# Patient Record
Sex: Female | Born: 1965 | Race: White | Hispanic: No | Marital: Married | Smoking: Former smoker
Health system: Southern US, Community
[De-identification: ages and names within clinical notes are randomized; demographics above are authoritative.]

## PROBLEM LIST (undated history)

## (undated) DIAGNOSIS — I499 Cardiac arrhythmia, unspecified: Secondary | ICD-10-CM

## (undated) DIAGNOSIS — N390 Urinary tract infection, site not specified: Secondary | ICD-10-CM

## (undated) DIAGNOSIS — I809 Phlebitis and thrombophlebitis of unspecified site: Secondary | ICD-10-CM

## (undated) DIAGNOSIS — R42 Dizziness and giddiness: Secondary | ICD-10-CM

## (undated) DIAGNOSIS — R1319 Other dysphagia: Secondary | ICD-10-CM

## (undated) DIAGNOSIS — I10 Essential (primary) hypertension: Secondary | ICD-10-CM

## (undated) DIAGNOSIS — R112 Nausea with vomiting, unspecified: Secondary | ICD-10-CM

## (undated) DIAGNOSIS — Z9889 Other specified postprocedural states: Secondary | ICD-10-CM

## (undated) DIAGNOSIS — K219 Gastro-esophageal reflux disease without esophagitis: Secondary | ICD-10-CM

## (undated) HISTORY — PX: TUBAL LIGATION: SHX77

## (undated) HISTORY — PX: CHOLECYSTECTOMY: SHX55

## (undated) HISTORY — PX: ABDOMINOPLASTY: SUR9

## (undated) HISTORY — PX: SEPTOPLASTY: SUR1290

---

## 2010-09-26 HISTORY — PX: NECK SURGERY: SHX720

## 2011-10-15 ENCOUNTER — Other Ambulatory Visit: Payer: Self-pay | Admitting: Neurosurgery

## 2011-11-25 ENCOUNTER — Encounter (HOSPITAL_COMMUNITY): Payer: Self-pay | Admitting: Pharmacy Technician

## 2011-11-26 ENCOUNTER — Encounter (HOSPITAL_COMMUNITY)
Admission: RE | Admit: 2011-11-26 | Discharge: 2011-11-26 | Disposition: A | Discharge status: Home / Self Care | Payer: BC Managed Care – PPO | Source: Ambulatory Visit | Attending: Neurosurgery | Admitting: Neurosurgery

## 2011-11-26 ENCOUNTER — Encounter (HOSPITAL_COMMUNITY): Payer: Self-pay

## 2011-11-26 HISTORY — DX: Essential (primary) hypertension: I10

## 2011-11-26 HISTORY — DX: Dizziness and giddiness: R42

## 2011-11-26 HISTORY — DX: Gastro-esophageal reflux disease without esophagitis: K21.9

## 2011-11-26 HISTORY — DX: Other specified postprocedural states: Z98.890

## 2011-11-26 HISTORY — DX: Cardiac arrhythmia, unspecified: I49.9

## 2011-11-26 HISTORY — DX: Other specified postprocedural states: R11.2

## 2011-11-26 HISTORY — DX: Urinary tract infection, site not specified: N39.0

## 2011-11-26 HISTORY — DX: Other dysphagia: R13.19

## 2011-11-26 LAB — CBC
MCV: 89.9 fL (ref 78.0–100.0)
Platelets: 256 10*3/uL (ref 150–400)
RBC: 4.65 MIL/uL (ref 3.87–5.11)
RDW: 12.4 % (ref 11.5–15.5)
WBC: 10.7 10*3/uL — ABNORMAL HIGH (ref 4.0–10.5)

## 2011-11-26 LAB — SURGICAL PCR SCREEN: Staphylococcus aureus: POSITIVE — AB

## 2011-11-26 LAB — HCG, SERUM, QUALITATIVE: Preg, Serum: NEGATIVE

## 2011-11-26 NOTE — Progress Notes (Addendum)
Primary Physician/Cardiologist - Dr. Earna Coder - Octavio Manns, [place] EKG, xray 3 weeks ago at Dr,. Earna Coder office - will request records Cardiac Cath in 2008

## 2011-11-26 NOTE — Pre-Procedure Instructions (Signed)
20 Stacy Johnston  11/26/2011   Your procedure is scheduled on:  Thursday, November 14th  Report to [facility] at 0530 AM.  Call this number if you have problems the morning of surgery: [phone]   Remember:   Do not eat food or drink:After Midnight.   Take these medicines the morning of surgery with A SIP OF WATER: toprol, valium if needed, nexium, hydrocodone if needed   Do not wear jewelry, make-up or nail polish.  Do not wear lotions, powders, or perfumes.   Do not shave 48 hours prior to surgery. Men may shave face and neck.  Do not bring valuables to the hospital.  Contacts, dentures or bridgework may not be worn into surgery.  Leave suitcase in the car. After surgery it may be brought to your room.  For patients admitted to the hospital, checkout time is 11:00 AM the day of discharge.   Patients discharged the day of surgery will not be allowed to drive home.   Special Instructions: Shower using CHG 2 nights before surgery and the night before surgery.  If you shower the day of surgery use CHG.  Use special wash - you have one bottle of CHG for all showers.  You should use approximately 1/3 of the bottle for each shower.   Please read over the following fact sheets that you were given: Pain Booklet, Coughing and Deep Breathing, MRSA Information and Surgical Site Infection Prevention

## 2011-11-29 NOTE — Progress Notes (Signed)
Dr. Oneita Jolly office does not have cxr or cath report. Stated to request at [facility], which I did request.

## 2011-11-29 NOTE — Progress Notes (Signed)
Received cath report from [facility].  Chest X-ray dated 12/2006. Will need cxr dos.

## 2011-12-08 MED ORDER — CEFAZOLIN SODIUM-DEXTROSE 2-3 GM-% IV SOLR
2.0000 g | INTRAVENOUS | Status: AC
  Administered 2011-12-09: 2 g via INTRAVENOUS
  Filled 2011-12-08: qty 50

## 2011-12-09 ENCOUNTER — Ambulatory Visit (HOSPITAL_COMMUNITY): Payer: BC Managed Care – PPO

## 2011-12-09 ENCOUNTER — Ambulatory Visit (HOSPITAL_COMMUNITY): Payer: BC Managed Care – PPO | Admitting: Certified Registered"

## 2011-12-09 ENCOUNTER — Encounter (HOSPITAL_COMMUNITY): Payer: Self-pay | Admitting: Certified Registered"

## 2011-12-09 ENCOUNTER — Encounter (HOSPITAL_COMMUNITY): Payer: Self-pay | Admitting: *Deleted

## 2011-12-09 ENCOUNTER — Observation Stay (HOSPITAL_COMMUNITY)
Admission: RE | Admit: 2011-12-09 | Discharge: 2011-12-10 | DRG: 473 | Disposition: A | Discharge status: Home / Self Care | Payer: BC Managed Care – PPO | Source: Ambulatory Visit | Attending: Neurosurgery | Admitting: Neurosurgery

## 2011-12-09 ENCOUNTER — Encounter (HOSPITAL_COMMUNITY)
Admission: RE | Disposition: A | Discharge status: Home / Self Care | Payer: Self-pay | Source: Ambulatory Visit | Attending: Neurosurgery

## 2011-12-09 DIAGNOSIS — T84498A Other mechanical complication of other internal orthopedic devices, implants and grafts, initial encounter: Principal | ICD-10-CM | POA: Insufficient documentation

## 2011-12-09 DIAGNOSIS — K219 Gastro-esophageal reflux disease without esophagitis: Secondary | ICD-10-CM | POA: Insufficient documentation

## 2011-12-09 DIAGNOSIS — Y838 Other surgical procedures as the cause of abnormal reaction of the patient, or of later complication, without mention of misadventure at the time of the procedure: Secondary | ICD-10-CM | POA: Insufficient documentation

## 2011-12-09 DIAGNOSIS — F17200 Nicotine dependence, unspecified, uncomplicated: Secondary | ICD-10-CM | POA: Insufficient documentation

## 2011-12-09 DIAGNOSIS — Z01812 Encounter for preprocedural laboratory examination: Secondary | ICD-10-CM | POA: Insufficient documentation

## 2011-12-09 DIAGNOSIS — G8929 Other chronic pain: Secondary | ICD-10-CM | POA: Insufficient documentation

## 2011-12-09 DIAGNOSIS — I10 Essential (primary) hypertension: Secondary | ICD-10-CM | POA: Insufficient documentation

## 2011-12-09 DIAGNOSIS — M47812 Spondylosis without myelopathy or radiculopathy, cervical region: Secondary | ICD-10-CM | POA: Insufficient documentation

## 2011-12-09 HISTORY — PX: POSTERIOR CERVICAL FUSION/FORAMINOTOMY: SHX5038

## 2011-12-09 HISTORY — DX: Phlebitis and thrombophlebitis of unspecified site: I80.9

## 2011-12-09 SURGERY — POSTERIOR CERVICAL FUSION/FORAMINOTOMY LEVEL 1
Anesthesia: General | Site: Neck | Wound class: Clean

## 2011-12-09 MED ORDER — PANTOPRAZOLE SODIUM 40 MG IV SOLR: 40.0000 mg | Freq: Every day | INTRAVENOUS | Status: DC

## 2011-12-09 MED ORDER — ACETAMINOPHEN 650 MG RE SUPP: 650.0000 mg | RECTAL | Status: DC | PRN

## 2011-12-09 MED ORDER — SODIUM CHLORIDE 0.9 % IJ SOLN: 3.0000 mL | INTRAMUSCULAR | Status: DC | PRN

## 2011-12-09 MED ORDER — LACTATED RINGERS IV SOLN
INTRAVENOUS | Status: DC | PRN
  Administered 2011-12-09 (×2): via INTRAVENOUS

## 2011-12-09 MED ORDER — HYDROMORPHONE HCL PF 1 MG/ML IJ SOLN
0.2500 mg | INTRAMUSCULAR | Status: DC | PRN
  Administered 2011-12-09 (×4): 0.5 mg via INTRAVENOUS

## 2011-12-09 MED ORDER — 0.9 % SODIUM CHLORIDE (POUR BTL) OPTIME
TOPICAL | Status: DC | PRN
  Administered 2011-12-09: 1000 mL

## 2011-12-09 MED ORDER — MUPIROCIN 2 % EX OINT: 1.0000 "application " | TOPICAL_OINTMENT | Freq: Two times a day (BID) | CUTANEOUS | Status: DC

## 2011-12-09 MED ORDER — DIAZEPAM 5 MG PO TABS
5.0000 mg | ORAL_TABLET | Freq: Four times a day (QID) | ORAL | Status: DC | PRN
  Administered 2011-12-09 – 2011-12-10 (×3): 5 mg via ORAL
  Administered 2011-12-10: 10 mg via ORAL
  Filled 2011-12-09 (×3): qty 1
  Filled 2011-12-09: qty 2

## 2011-12-09 MED ORDER — HYDROXYZINE HCL 50 MG/ML IM SOLN
25.0000 mg | Freq: Four times a day (QID) | INTRAMUSCULAR | Status: DC | PRN
  Administered 2011-12-09: 25 mg via INTRAMUSCULAR
  Filled 2011-12-09: qty 1

## 2011-12-09 MED ORDER — FENTANYL CITRATE 0.05 MG/ML IJ SOLN
INTRAMUSCULAR | Status: DC | PRN
  Administered 2011-12-09 (×5): 50 ug via INTRAVENOUS

## 2011-12-09 MED ORDER — POLYETHYLENE GLYCOL 3350 17 G PO PACK
17.0000 g | PACK | Freq: Every day | ORAL | Status: DC | PRN
  Filled 2011-12-09: qty 1

## 2011-12-09 MED ORDER — LIDOCAINE-EPINEPHRINE 1 %-1:100000 IJ SOLN
INTRAMUSCULAR | Status: DC | PRN
  Administered 2011-12-09: 5 mL

## 2011-12-09 MED ORDER — ONDANSETRON HCL 4 MG/2ML IJ SOLN
4.0000 mg | INTRAMUSCULAR | Status: DC | PRN
  Administered 2011-12-09: 4 mg via INTRAVENOUS
  Filled 2011-12-09: qty 2

## 2011-12-09 MED ORDER — ARTIFICIAL TEARS OP OINT
TOPICAL_OINTMENT | OPHTHALMIC | Status: DC | PRN
  Administered 2011-12-09: 1 via OPHTHALMIC

## 2011-12-09 MED ORDER — SODIUM CHLORIDE 0.9 % IV SOLN
INTRAVENOUS | Status: AC
  Filled 2011-12-09: qty 500

## 2011-12-09 MED ORDER — SODIUM CHLORIDE 0.9 % IR SOLN
Status: DC | PRN
  Administered 2011-12-09: 09:00:00

## 2011-12-09 MED ORDER — HYDROMORPHONE HCL PF 1 MG/ML IJ SOLN: 0.5000 mg | INTRAMUSCULAR | Status: DC | PRN

## 2011-12-09 MED ORDER — LIDOCAINE HCL (CARDIAC) 20 MG/ML IV SOLN
INTRAVENOUS | Status: DC | PRN
  Administered 2011-12-09: 80 mg via INTRAVENOUS

## 2011-12-09 MED ORDER — ALUM & MAG HYDROXIDE-SIMETH 200-200-20 MG/5ML PO SUSP: 30.0000 mL | Freq: Four times a day (QID) | ORAL | Status: DC | PRN

## 2011-12-09 MED ORDER — ACETAMINOPHEN 325 MG PO TABS: 650.0000 mg | ORAL_TABLET | ORAL | Status: DC | PRN

## 2011-12-09 MED ORDER — PANTOPRAZOLE SODIUM 40 MG PO TBEC
40.0000 mg | DELAYED_RELEASE_TABLET | Freq: Every day | ORAL | Status: DC
  Administered 2011-12-09: 40 mg via ORAL
  Filled 2011-12-09: qty 1

## 2011-12-09 MED ORDER — HYDROMORPHONE HCL PF 1 MG/ML IJ SOLN
INTRAMUSCULAR | Status: AC
  Filled 2011-12-09: qty 1

## 2011-12-09 MED ORDER — PROPOFOL 10 MG/ML IV BOLUS
INTRAVENOUS | Status: DC | PRN
  Administered 2011-12-09: 40 mg via INTRAVENOUS
  Administered 2011-12-09: 160 mg via INTRAVENOUS

## 2011-12-09 MED ORDER — MENTHOL 3 MG MT LOZG: 1.0000 | LOZENGE | OROMUCOSAL | Status: DC | PRN

## 2011-12-09 MED ORDER — FLEET ENEMA 7-19 GM/118ML RE ENEM
1.0000 | ENEMA | Freq: Once | RECTAL | Status: AC | PRN
  Filled 2011-12-09: qty 1

## 2011-12-09 MED ORDER — MIDAZOLAM HCL 5 MG/5ML IJ SOLN
INTRAMUSCULAR | Status: DC | PRN
  Administered 2011-12-09: 2 mg via INTRAVENOUS

## 2011-12-09 MED ORDER — SENNA 8.6 MG PO TABS
1.0000 | ORAL_TABLET | Freq: Two times a day (BID) | ORAL | Status: DC
  Administered 2011-12-09 – 2011-12-10 (×2): 8.6 mg via ORAL
  Filled 2011-12-09 (×3): qty 1

## 2011-12-09 MED ORDER — ONDANSETRON HCL 4 MG/2ML IJ SOLN
INTRAMUSCULAR | Status: DC | PRN
  Administered 2011-12-09: 4 mg via INTRAVENOUS

## 2011-12-09 MED ORDER — PHENOL 1.4 % MT LIQD: 1.0000 | OROMUCOSAL | Status: DC | PRN

## 2011-12-09 MED ORDER — SODIUM CHLORIDE 0.9 % IJ SOLN
3.0000 mL | Freq: Two times a day (BID) | INTRAMUSCULAR | Status: DC
  Administered 2011-12-09: 3 mL via INTRAVENOUS

## 2011-12-09 MED ORDER — CEFAZOLIN SODIUM 1-5 GM-% IV SOLN
1.0000 g | Freq: Three times a day (TID) | INTRAVENOUS | Status: AC
  Administered 2011-12-09 (×2): 1 g via INTRAVENOUS
  Filled 2011-12-09 (×2): qty 50

## 2011-12-09 MED ORDER — DOCUSATE SODIUM 100 MG PO CAPS
100.0000 mg | ORAL_CAPSULE | Freq: Two times a day (BID) | ORAL | Status: DC
  Administered 2011-12-09 – 2011-12-10 (×2): 100 mg via ORAL
  Filled 2011-12-09 (×2): qty 1

## 2011-12-09 MED ORDER — HYDROCODONE-ACETAMINOPHEN 5-325 MG PO TABS
ORAL_TABLET | ORAL | Status: AC
  Filled 2011-12-09: qty 1

## 2011-12-09 MED ORDER — BACITRACIN ZINC 500 UNIT/GM EX OINT
TOPICAL_OINTMENT | CUTANEOUS | Status: DC | PRN
  Administered 2011-12-09: 1 via TOPICAL

## 2011-12-09 MED ORDER — ADULT MULTIVITAMIN W/MINERALS CH
1.0000 | ORAL_TABLET | Freq: Every day | ORAL | Status: DC
  Administered 2011-12-09: 1 via ORAL
  Filled 2011-12-09 (×2): qty 1

## 2011-12-09 MED ORDER — LAMOTRIGINE 25 MG PO TABS
50.0000 mg | ORAL_TABLET | Freq: Every day | ORAL | Status: DC
  Administered 2011-12-09: 50 mg via ORAL
  Filled 2011-12-09 (×2): qty 2

## 2011-12-09 MED ORDER — DOXEPIN HCL 50 MG PO CAPS
50.0000 mg | ORAL_CAPSULE | Freq: Every day | ORAL | Status: DC
  Administered 2011-12-09: 50 mg via ORAL
  Filled 2011-12-09 (×2): qty 1

## 2011-12-09 MED ORDER — OXYCODONE HCL 5 MG/5ML PO SOLN: 5.0000 mg | Freq: Once | ORAL | Status: DC | PRN

## 2011-12-09 MED ORDER — THROMBIN 5000 UNITS EX SOLR
CUTANEOUS | Status: DC | PRN
  Administered 2011-12-09 (×2): 5000 [IU] via TOPICAL

## 2011-12-09 MED ORDER — SODIUM CHLORIDE 0.9 % IV SOLN: 250.0000 mL | INTRAVENOUS | Status: DC

## 2011-12-09 MED ORDER — OXYCODONE-ACETAMINOPHEN 5-325 MG PO TABS
1.0000 | ORAL_TABLET | ORAL | Status: DC | PRN
  Administered 2011-12-09 – 2011-12-10 (×4): 2 via ORAL
  Filled 2011-12-09 (×4): qty 2

## 2011-12-09 MED ORDER — OXYCODONE HCL 5 MG PO TABS: 5.0000 mg | ORAL_TABLET | Freq: Once | ORAL | Status: DC | PRN

## 2011-12-09 MED ORDER — BACITRACIN 50000 UNITS IM SOLR
INTRAMUSCULAR | Status: AC
  Filled 2011-12-09: qty 1

## 2011-12-09 MED ORDER — BISACODYL 10 MG RE SUPP: 10.0000 mg | Freq: Every day | RECTAL | Status: DC | PRN

## 2011-12-09 MED ORDER — HYDROCODONE-ACETAMINOPHEN 10-325 MG PO TABS: 1.0000 | ORAL_TABLET | Freq: Three times a day (TID) | ORAL | Status: DC | PRN

## 2011-12-09 MED ORDER — SCOPOLAMINE 1 MG/3DAYS TD PT72
MEDICATED_PATCH | TRANSDERMAL | Status: AC
  Filled 2011-12-09: qty 1

## 2011-12-09 MED ORDER — HYDROCODONE-ACETAMINOPHEN 5-325 MG PO TABS
1.0000 | ORAL_TABLET | ORAL | Status: DC | PRN
  Administered 2011-12-09: 1 via ORAL
  Filled 2011-12-09: qty 2

## 2011-12-09 MED ORDER — KCL IN DEXTROSE-NACL 20-5-0.45 MEQ/L-%-% IV SOLN
INTRAVENOUS | Status: DC
  Filled 2011-12-09 (×3): qty 1000

## 2011-12-09 MED ORDER — ALPRAZOLAM 0.5 MG PO TABS
1.0000 mg | ORAL_TABLET | Freq: Every day | ORAL | Status: DC
  Administered 2011-12-09: 1 mg via ORAL
  Filled 2011-12-09: qty 2

## 2011-12-09 MED ORDER — ROCURONIUM BROMIDE 100 MG/10ML IV SOLN
INTRAVENOUS | Status: DC | PRN
  Administered 2011-12-09: 50 mg via INTRAVENOUS

## 2011-12-09 MED ORDER — HYDROCHLOROTHIAZIDE 12.5 MG PO CAPS
12.5000 mg | ORAL_CAPSULE | Freq: Every day | ORAL | Status: DC
  Administered 2011-12-09: 12.5 mg via ORAL
  Filled 2011-12-09 (×2): qty 1

## 2011-12-09 MED ORDER — MORPHINE SULFATE 2 MG/ML IJ SOLN
1.0000 mg | INTRAMUSCULAR | Status: DC | PRN
  Administered 2011-12-09 – 2011-12-10 (×5): 4 mg via INTRAVENOUS
  Filled 2011-12-09 (×5): qty 2

## 2011-12-09 MED ORDER — BUPIVACAINE HCL (PF) 0.5 % IJ SOLN
INTRAMUSCULAR | Status: DC | PRN
  Administered 2011-12-09: 5 mL

## 2011-12-09 MED ORDER — SCOPOLAMINE 1 MG/3DAYS TD PT72
MEDICATED_PATCH | TRANSDERMAL | Status: DC | PRN
  Administered 2011-12-09: 1 via TRANSDERMAL

## 2011-12-09 MED ORDER — PROMETHAZINE HCL 25 MG/ML IJ SOLN: 6.2500 mg | INTRAMUSCULAR | Status: DC | PRN

## 2011-12-09 MED ORDER — METOPROLOL SUCCINATE ER 50 MG PO TB24
50.0000 mg | ORAL_TABLET | Freq: Every day | ORAL | Status: DC
  Filled 2011-12-09 (×2): qty 1

## 2011-12-09 MED ORDER — PROMETHAZINE HCL 25 MG/ML IJ SOLN
INTRAMUSCULAR | Status: AC
  Filled 2011-12-09: qty 1

## 2011-12-09 MED ORDER — HEMOSTATIC AGENTS (NO CHARGE) OPTIME
TOPICAL | Status: DC | PRN
  Administered 2011-12-09: 1 via TOPICAL

## 2011-12-09 MED ORDER — DIAZEPAM 5 MG PO TABS
ORAL_TABLET | ORAL | Status: AC
  Filled 2011-12-09: qty 1

## 2011-12-09 SURGICAL SUPPLY — 71 items
BAG DECANTER FOR FLEXI CONT (MISCELLANEOUS) ×2 IMPLANT
BENZOIN TINCTURE PRP APPL 2/3 (GAUZE/BANDAGES/DRESSINGS) ×4 IMPLANT
BIT DRILL NEURO 2X3.1 SFT TUCH (MISCELLANEOUS) ×1 IMPLANT
BLADE SURG 11 STRL SS (BLADE) ×2 IMPLANT
BLADE SURG ROTATE 9660 (MISCELLANEOUS) IMPLANT
BLADE ULTRA TIP 2M (BLADE) IMPLANT
BONE VOID FILLER STRIP 10CC (Bone Implant) ×2 IMPLANT
BUR PRECISION FLUTE 5.0 (BURR) ×2 IMPLANT
CANISTER SUCTION 2500CC (MISCELLANEOUS) ×2 IMPLANT
CLOTH BEACON ORANGE TIMEOUT ST (SAFETY) ×2 IMPLANT
CONT SPEC 4OZ CLIKSEAL STRL BL (MISCELLANEOUS) ×2 IMPLANT
DRAPE LAPAROTOMY 100X72 PEDS (DRAPES) ×2 IMPLANT
DRAPE MICROSCOPE LEICA (MISCELLANEOUS) IMPLANT
DRAPE POUCH INSTRU U-SHP 10X18 (DRAPES) ×2 IMPLANT
DRESSING TELFA 8X3 (GAUZE/BANDAGES/DRESSINGS) ×2 IMPLANT
DRILL NEURO 2X3.1 SOFT TOUCH (MISCELLANEOUS) ×2
DRSG PAD ABDOMINAL 8X10 ST (GAUZE/BANDAGES/DRESSINGS) ×4 IMPLANT
DURAPREP 6ML APPLICATOR 50/CS (WOUND CARE) ×6 IMPLANT
ELECT REM PT RETURN 9FT ADLT (ELECTROSURGICAL) ×2
ELECTRODE REM PT RTRN 9FT ADLT (ELECTROSURGICAL) ×1 IMPLANT
GAUZE SPONGE 4X4 16PLY XRAY LF (GAUZE/BANDAGES/DRESSINGS) IMPLANT
GLOVE BIO SURGEON STRL SZ8 (GLOVE) ×2 IMPLANT
GLOVE BIOGEL PI IND STRL 8 (GLOVE) ×1 IMPLANT
GLOVE BIOGEL PI IND STRL 8.5 (GLOVE) ×2 IMPLANT
GLOVE BIOGEL PI INDICATOR 8 (GLOVE) ×1
GLOVE BIOGEL PI INDICATOR 8.5 (GLOVE) ×2
GLOVE ECLIPSE 7.5 STRL STRAW (GLOVE) ×2 IMPLANT
GLOVE ECLIPSE 8.0 STRL XLNG CF (GLOVE) ×2 IMPLANT
GLOVE EXAM NITRILE LRG STRL (GLOVE) IMPLANT
GLOVE EXAM NITRILE MD LF STRL (GLOVE) IMPLANT
GLOVE EXAM NITRILE XL STR (GLOVE) IMPLANT
GLOVE EXAM NITRILE XS STR PU (GLOVE) IMPLANT
GLOVE SURG SS PI 8.0 STRL IVOR (GLOVE) ×4 IMPLANT
GOWN BRE IMP SLV AUR LG STRL (GOWN DISPOSABLE) IMPLANT
GOWN BRE IMP SLV AUR XL STRL (GOWN DISPOSABLE) ×6 IMPLANT
GOWN STRL REIN 2XL LVL4 (GOWN DISPOSABLE) ×2 IMPLANT
HEMOSTAT SURGICEL 2X14 (HEMOSTASIS) IMPLANT
KIT BASIN OR (CUSTOM PROCEDURE TRAY) ×2 IMPLANT
KIT INFUSE X SMALL 1.4CC (Orthopedic Implant) ×2 IMPLANT
KIT ROOM TURNOVER OR (KITS) ×2 IMPLANT
MARKER SKIN DUAL TIP RULER LAB (MISCELLANEOUS) ×2 IMPLANT
NEEDLE HYPO 18GX1.5 BLUNT FILL (NEEDLE) IMPLANT
NEEDLE HYPO 25X1 1.5 SAFETY (NEEDLE) ×2 IMPLANT
NEEDLE SPNL 22GX3.5 QUINCKE BK (NEEDLE) ×2 IMPLANT
NS IRRIG 1000ML POUR BTL (IV SOLUTION) ×2 IMPLANT
PACK LAMINECTOMY NEURO (CUSTOM PROCEDURE TRAY) ×2 IMPLANT
PIN MAYFIELD SKULL DISP (PIN) ×2 IMPLANT
ROD 120MM (Rod) ×1 IMPLANT
ROD SPNL 120X3.3XNS LF CVD (Rod) ×1 IMPLANT
RUBBERBAND STERILE (MISCELLANEOUS) IMPLANT
SCREW 8MM (Screw) ×2 IMPLANT
SCREW POLYAXIAL 3.5X10MM (Screw) ×6 IMPLANT
SCREW SET (Screw) ×8 IMPLANT
SPONGE GAUZE 4X4 12PLY (GAUZE/BANDAGES/DRESSINGS) ×2 IMPLANT
SPONGE INTESTINAL PEANUT (DISPOSABLE) ×2 IMPLANT
SPONGE SURGIFOAM ABS GEL SZ50 (HEMOSTASIS) ×2 IMPLANT
STAPLER SKIN PROX WIDE 3.9 (STAPLE) ×2 IMPLANT
STRIP CLOSURE SKIN 1/2X4 (GAUZE/BANDAGES/DRESSINGS) ×4 IMPLANT
SUT ETHILON 3 0 FSL (SUTURE) ×2 IMPLANT
SUT VIC AB 0 CT1 18XCR BRD8 (SUTURE) ×1 IMPLANT
SUT VIC AB 0 CT1 8-18 (SUTURE) ×1
SUT VIC AB 2-0 CP2 18 (SUTURE) ×2 IMPLANT
SUT VIC AB 3-0 SH 8-18 (SUTURE) IMPLANT
SYR 20ML ECCENTRIC (SYRINGE) ×2 IMPLANT
SYR 3ML LL SCALE MARK (SYRINGE) IMPLANT
TAPE CLOTH SURG 4X10 WHT LF (GAUZE/BANDAGES/DRESSINGS) ×2 IMPLANT
TOWEL OR 17X24 6PK STRL BLUE (TOWEL DISPOSABLE) ×2 IMPLANT
TOWEL OR 17X26 10 PK STRL BLUE (TOWEL DISPOSABLE) ×2 IMPLANT
TRAY FOLEY CATH 14FRSI W/METER (CATHETERS) IMPLANT
UNDERPAD 30X30 INCONTINENT (UNDERPADS AND DIAPERS) ×2 IMPLANT
WATER STERILE IRR 1000ML POUR (IV SOLUTION) ×2 IMPLANT

## 2011-12-09 NOTE — Brief Op Note (Signed)
12/09/2011  9:22 AM  PATIENT:  Stacy Johnston  46 y.o. 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, cervical spondylosis   PROCEDURE:  Procedure(s) (LRB) with comments: POSTERIOR CERVICAL FUSION/FORAMINOTOMY LEVEL 1 (N/A) - Cervical five-six Posterior cervical fusion  SURGEON:  Surgeon(s) and Role:    * Maeola Harman, MD - Primary    * Reinaldo Meeker, MD - Assisting  PHYSICIAN ASSISTANT:   ASSISTANTS: Poteat, RN   ANESTHESIA:   general  EBL:  Total I/O In: 1700 [I.V.:1700] Out: 25 [Blood:25]  BLOOD ADMINISTERED:none  DRAINS: none   LOCAL MEDICATIONS USED:  MARCAINE     SPECIMEN:  No Specimen  DISPOSITION OF SPECIMEN:  N/A  COUNTS:  YES  TOURNIQUET:  * No tourniquets in log *  DICTATION: DICTATION: Patient is 46 year old woman with neck pain and cervical radiculopathy, s/p ACDF C56 with incomplete healing and chronic pain.  It was elected to take the patient to surgery for posterior cervical fusion.  PROCEDURE: Patient was brought to the OR and following smooth and uncomplicated induction of GETA, patient was placed in 3 pin fixation and rolled into a prone position on the OR table.  Shoulders were taped to facilitate Xray visualization. Posterior neck was prepped and draped in the usual fashion.  Area of planned incision was infiltrated with local lidocaine.  Incision was made from C5-7 and carried through the avascular midline plane to expose these levels and their lateral masses.  There did not appear to be solid fusion at the previously operated levels.  Lateral mass screws were placed from C5 to C6 bilaterally according to standard landmarks and their positioning was confirmed with fluoroscopy.  The facet joints and laminae were decorticated and packed with extra small BMP and NexOss bone graft extender.   Screws and rods were locked down in situ.  Wound was closed with 0, 2-0, and 3-0 vicryl sutures.  Sterile occlusive dressing was placed.  Patient was taken out of pins and turned back onto the OR table.  She had some bleeding from a pin site which was controlled with a staple. Patient was extubated in the operating room and taken to recovery in stable and satisfactory condition.  Counts were correct at the end of the case.  PLAN OF CARE: Admit for overnight observation  PATIENT DISPOSITION:  PACU - hemodynamically stable.   Delay start of Pharmacological VTE agent (>24hrs) due to surgical blood loss or risk of bleeding: yes

## 2011-12-09 NOTE — Anesthesia Preprocedure Evaluation (Addendum)
Anesthesia Evaluation  Patient identified by MRN, date of birth, ID band Patient awake    Reviewed: Allergy & Precautions, H&P , NPO status , Patient's Chart, lab work & pertinent test results  History of Anesthesia Complications (+) PONV  Airway Mallampati: I TM Distance: >3 FB Neck ROM: Full    Dental  (+) Teeth Intact and Dental Advisory Given   Pulmonary former smoker,  breath sounds clear to auscultation        Cardiovascular hypertension, Rhythm:Regular Rate:Normal     Neuro/Psych    GI/Hepatic GERD-  Medicated,  Endo/Other    Renal/GU      Musculoskeletal   Abdominal (+) - obese,   Peds  Hematology   Anesthesia Other Findings   Reproductive/Obstetrics                          Anesthesia Physical Anesthesia Plan  ASA: II  Anesthesia Plan: General   Post-op Pain Management:    Induction: Intravenous  Airway Management Planned: Oral ETT  Additional Equipment:   Intra-op Plan:   Post-operative Plan: Extubation in OR  Informed Consent: I have reviewed the patients History and Physical, chart, labs and discussed the procedure including the risks, benefits and alternatives for the proposed anesthesia with the patient or authorized representative who has indicated his/her understanding and acceptance.     Plan Discussed with: CRNA and Surgeon  Anesthesia Plan Comments:         Anesthesia Quick Evaluation

## 2011-12-09 NOTE — Progress Notes (Signed)
Awake, alert, conversant.  C/o neck soreness.  Denies arm pain.  Full strength bilateral upper extremities.  Doing well.

## 2011-12-09 NOTE — Op Note (Signed)
12/09/2011  9:22 AM  PATIENT:  Stacy Johnston  46 y.o. 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, cervical spondylosis   PROCEDURE:  Procedure(s) (LRB) with comments: POSTERIOR CERVICAL FUSION/FORAMINOTOMY LEVEL 1 (N/A) - Cervical five-six Posterior cervical fusion  SURGEON:  Surgeon(s) and Role:    * Garnie Borchardt, MD - Primary    * Randy O Kritzer, MD - Assisting  PHYSICIAN ASSISTANT:   ASSISTANTS: Poteat, RN   ANESTHESIA:   general  EBL:  Total I/O In: 1700 [I.V.:1700] Out: 25 [Blood:25]  BLOOD ADMINISTERED:none  DRAINS: none   LOCAL MEDICATIONS USED:  MARCAINE     SPECIMEN:  No Specimen  DISPOSITION OF SPECIMEN:  N/A  COUNTS:  YES  TOURNIQUET:  * No tourniquets in log *  DICTATION: DICTATION: Patient is 46 year old woman with neck pain and cervical radiculopathy, s/p ACDF C56 with incomplete healing and chronic pain.  It was elected to take the patient to surgery for posterior cervical fusion.  PROCEDURE: Patient was brought to the OR and following smooth and uncomplicated induction of GETA, patient was placed in 3 pin fixation and rolled into a prone position on the OR table.  Shoulders were taped to facilitate Xray visualization. Posterior neck was prepped and draped in the usual fashion.  Area of planned incision was infiltrated with local lidocaine.  Incision was made from C5-7 and carried through the avascular midline plane to expose these levels and their lateral masses.  There did not appear to be solid fusion at the previously operated levels.  Lateral mass screws were placed from C5 to C6 bilaterally according to standard landmarks and their positioning was confirmed with fluoroscopy.  The facet joints and laminae were decorticated and packed with extra small BMP and NexOss bone graft extender.   Screws and rods were locked down in situ.  Wound was closed with 0, 2-0, and 3-0 vicryl sutures.  Sterile occlusive dressing was placed.  Patient was taken out of pins and turned back onto the OR table.  She had some bleeding from a pin site which was controlled with a staple. Patient was extubated in the operating room and taken to recovery in stable and satisfactory condition.  Counts were correct at the end of the case.  PLAN OF CARE: Admit for overnight observation  PATIENT DISPOSITION:  PACU - hemodynamically stable.   Delay start of Pharmacological VTE agent (>24hrs) due to surgical blood loss or risk of bleeding: yes  

## 2011-12-09 NOTE — Transfer of Care (Signed)
Immediate Anesthesia Transfer of Care Note  Patient: Stacy Johnston  Procedure(s) Performed: Procedure(s) (LRB) with comments: POSTERIOR CERVICAL FUSION/FORAMINOTOMY LEVEL 1 (N/A) - Cervical five-six Posterior cervical fusion  Patient Location: PACU  Anesthesia Type:General  Level of Consciousness: awake, alert  and oriented  Airway & Oxygen Therapy: Patient Spontanous Breathing and Patient connected to nasal cannula oxygen  Post-op Assessment: Report given to PACU RN, Post -op Vital signs reviewed and stable and Patient moving all extremities X 4  Post vital signs: Reviewed and stable  Complications: No apparent anesthesia complications

## 2011-12-09 NOTE — Interval H&P Note (Signed)
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 After consideration of risks, benefits and other options for treatment, the patient has consented to  Procedure(s) (LRB) with comments: POSTERIOR CERVICAL FUSION/FORAMINOTOMY LEVEL 1 (N/A) - C5-6 Posterior cervical fusion as a surgical intervention .  The patient's history has been reviewed, patient examined, no change in status, stable for surgery.  I have reviewed the patient's chart and labs.  Questions were answered to the patient's satisfaction.     Bastion Bolger D  Date of Initial H&P: 12/09/2011  History reviewed, patient examined, no change in status, stable for surgery.

## 2011-12-09 NOTE — Anesthesia Postprocedure Evaluation (Signed)
  Anesthesia Post-op Note  Patient: Stacy Johnston  Procedure(s) Performed: Procedure(s) (LRB) with comments: POSTERIOR CERVICAL FUSION/FORAMINOTOMY LEVEL 1 (N/A) - Cervical five-six Posterior cervical fusion  Patient Location: PACU  Anesthesia Type:General  Level of Consciousness: awake  Airway and Oxygen Therapy: Patient Spontanous Breathing  Post-op Pain: mild  Post-op Assessment: Post-op Vital signs reviewed, Patient's Cardiovascular Status Stable, Respiratory Function Stable, Patent Airway, No signs of Nausea or vomiting and Pain level controlled  Post-op Vital Signs: stable  Complications: No apparent anesthesia complications

## 2011-12-09 NOTE — Progress Notes (Signed)
Patient ID: Stacy Johnston, female   DOB: March 30, 1965, 46 y.o.   MRN: [id number]  Alert, conversant, smiling.  Pt notes some shoulder/posterior neck pain, stating "its not as bad as it was".  Good strength BUE. Drsg intact, dry. Will mobilize & plan for d/c to home in am.  Georgiann Cocker, RN, BSN

## 2011-12-09 NOTE — H&P (Signed)
Ingris Pasquarella  #[id number]  DOB:  [birth year]   11/03/2011:  Jniyah Dantuono returns today still having neck pain and still quite limited in terms of her level of discomfort in her neck.    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    I have recommended based on this that we proceed with posterior cervical fusion at C5-6 level.  She wishes to do so and we will proceed with this the middle of November.  Risks and benefits were discussed with the patient today.  She was fitted for a hard cervical collar and I discussed the situation with her and her husband and answered their questions at length.                                           Danae Orleans. Venetia Maxon, M.D./sv   Buffy Ehler  #[id number]  DOB:  1965-06-13  09/13/2011:  Rebbeca Sheperd comes in today.  She is complaining of left shoulder pain on a daily basis and describes intermittent stabbing, also lately getting some pain on the right.  She says her neck pain is "lower" on her spine lately.  She has been taking Flexeril 10 mg 2 to 3 per day, Hydrocodone 10/325 at least 3 per week.  She states that she is quite uncomfortable.    Her strength appears to be full on confrontational testing.    At this point, I would like to get a sense of whether she has a solid arthrodesis at the previously operated level and I will recommend getting a CT scan of her cervical spine.  I will see her back after that has been performed and make further recommendations at that point.          Danae Orleans. Venetia Maxon, M.D./sv Hadyn Azer  #[id number]  DOB:  Jan 13, 1966  04/19/2011:  Diamonique Ruedas comes in today with increasing pain into her left arm which she describes as "different" and "new".  She has been taking Tramadol 50 mg which she says makes her sick.  She is using Flexeril 10 mg and Robaxin 500 mg.  She has been using Hydrocodone 10/325 as needed.    Her examination is suggestive of a left cervical radiculopathy and she is having significant neck discomfort.  She does not appear to have  any focal weakness on examination.    I would like to obtain new x-rays including flexion and extension views of her cervical spine, as well as an MRI of her cervical spine and I will make further recommendations after those studies have been done.        Danae Orleans. Venetia Maxon, M.D./sv [facility]  Evlyn Kanner. 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  She says that she has daily pain in her neck and into her left arm and back pain into her left leg.  She notes numbness in her left arm and left leg involving the fourth and fifth digits and she says when she stands her left leg falls asleep and that she has weakness in her left leg.  She says her back has been bothering her for 2 years and her neck has been bothering her for a  year and has been increasing recently.  She says that Dr. Orpah Greek has recommended a neck fusion.  She is currently taking Tylenol 500 mg 2 twice daily to 3 x daily, Flexeril 10 mg, Tramadol 50 mg rarely.  She has had previous injection.  She has a history of hypertension, occasional irregular pulse and hiatal hernia.  She complains of daily neck pain and says her low back is not as bad.    REVIEW OF SYSTEMS:   Review of Systems was reviewed with the patient.  Pertinent positives include high blood pressure, occasional irregular pulse, leg pain while walking, leg weakness, back pain, arm pain, leg pain, joint pain, neck pain, problems with memory, anxiety.    PAST MEDICAL HISTORY:      Current Medical Conditions:  As previously described.      Prior Operations and Hospitalizations:  C-section, septoplasty, gallbladder.      Medications and Allergies:  Medications include Nexium 40 mg qam, Doxepin 50 mg qhs, HCTZ 12.5 mg qhs, Toprol XL 50 mg qhs, Lamictal 25 mg qhs, Probiotics qhs, Alprazolam 1 mg qhs,  multivitamin qhs.  ASPIRIN CAUSES CHEST PAIN.      Height and Weight:  She is 5', 3" tall and 166 lbs.    FAMILY HISTORY:    Both parents are 85 and in good health.    SOCIAL HISTORY:    She says that she smokes 3 to 5 cigarettes daily and is a social drinker of alcoholic beverages.  She denies any history of drug abuse.    DIAGNOSTIC STUDIES:   I reviewed an MRI of the lumbar spine which was performed on 06/24/2008 which shows some degenerative changes at L4-5 and L5-S1 levels.    She had an MRI of her cervical spine which demonstrates at C5-6 a herniated disc with left C6 nerve root compression.    PHYSICAL EXAMINATION:      General Appearance:  Mrs. Anzivino is a pleasant, cooperative woman in no acute distress.      Blood Pressure, Pulse and Respiratory Rate:  Her blood pressure is 118/86.  Heart rate is 74 and regular.  Respiratory rate is 18.        HEENT - normocephalic, atraumatic.  The pupils are equal, round and reactive to light.  The extraocular muscles are intact.  Sclerae - white.  Conjunctiva - pink.  Oropharynx benign.  Uvula midline.     Neck - normal with the exception of positive Spurlings' maneuver to the left with left parascapular discomfort to palpation.      Respiratory - there is normal respiratory effort with good intercostal function.  Lungs are clear to auscultation.  There are no rales, rhonchi or wheezes.      Cardiovascular - the heart has regular rate and rhythm to auscultation.  No murmurs are appreciated.  There is no extremity edema, cyanosis or clubbing.  There are palpable pedal pulses.      Abdomen - soft, nontender, no hepatosplenomegaly appreciated or masses.  There are active bowel sounds.  No guarding or rebound.      Musculoskeletal Examination - she is able to stand on her heels and toes and able to bend to touch her toes.  She has pain at the lumbosacral junction.  She has negative seated straight leg raise bilaterally.    NEUROLOGICAL  EXAMINATION: The patient is oriented to time, person and place and has good recall of both recent and remote memory with normal attention span and concentration.  The patient speaks with clear and fluent speech and exhibits normal language function and appropriate fund of knowledge.      Cranial Nerve Examination - pupils are equal, round and reactive to light.  Extraocular movements are full.  Visual fields are full to confrontational testing.  Facial sensation and facial movement are symmetric and intact.  Hearing is intact to finger rub.  Palate is upgoing.  Shoulder shrug is symmetric.  Tongue protrudes in the midline.    Motor Examination - motor strength is 5/5 in the bilateral upper extremities with the exception of left biceps strength at 4/5, left wrist extension strength at 4/5.  In the lower extremities motor strength is 5/5 in hip flexion, extension, quadriceps, hamstrings, plantar flexion, dorsiflexion and extensor hallucis longus.      Sensory Examination - she has decreased pin sensation in a left C5 distribution.      Deep Tendon Reflexes - 2 in the biceps, triceps and brachioradialis, 2 at the knees, 2 at the ankles and great toes are downgoing to plantar stimulation.      Cerebellar Examination - normal coordination in upper and lower extremities and normal rapid alternating movements.  Romberg test is negative.    IMPRESSION AND RECOMMENDATIONS:   Nichol Kawa is a 46 year old woman with neck pain and left arm pain and weakness.  She has milder lumbar degenerative changes.  I suggested that she stop smoking.  I also advised her to engage in a course of physical therapy including home traction and massage and see how she does with that for 2 to 3 x a week for the next 4 to 6 weeks.  I have set her up to come back in a month.  If she continues to have significant weakness, we may need to discuss surgical intervention, but at this point I would hold off on recommending that.    [facility]    Danae Orleans. Venetia Maxon, M.D.

## 2011-12-09 NOTE — Progress Notes (Signed)
As above.

## 2011-12-10 NOTE — Discharge Summary (Signed)
Physician Discharge Summary  Patient ID: Stacy Johnston MRN: [id number] DOB/AGE: 44-Jan-1967 44 y.o.  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 *    Discharged Condition: good  Hospital Course: [name] was admitted 12-09-11 for surgery with dx pseudoarthrosis C5-6, cervical spondylosis, radiculopathy.  Following uncomplicated posterior cervical fusion, she recovered well in Neuro PACU and transferred to 3500 for overnight observation.   Consults: None  Significant Diagnostic Studies: radiology: X-Ray: intra-operative  Treatments: surgery: POSTERIOR CERVICAL FUSION/FORAMINOTOMY LEVEL 1 (N/A) - Cervical five-six Posterior cervical fusion   Discharge Exam: Blood pressure 97/70, pulse 86, temperature 99.5 F (37.5 C), temperature source Oral, resp. rate 16, last menstrual period 11/29/2011, SpO2 96.00%. Alert, conversant. Reports posterior neck & trapezius pain - reassured. Drsg without drainage, removed. Steri's intact. No erythema, swelling, or drainage. Good strength BUE.    Disposition: Discharge to home - self care.  Pt will visit office next week for scalp staple removal & schedule3-4 week f/u appt as well.  Pt & husband verbalize understanding of d/c instructions. Rx's to Pt: Percocet 10/325 1 po q4-6 hrs prn pain #60; Valium 5mg  1-2 po q8hrs prn spasm #50; Phenergan 12.5mg  1-2 po q6hrs prn nausea/vomiting #20.        Medication List     As of 12/10/2011 10:16 AM    ASK your doctor about these medications         ALPRAZolam 1 MG tablet   Commonly known as: XANAX   Take 1 mg by mouth at  bedtime.      diazepam 5 MG tablet   Commonly known as: VALIUM   Take 5 mg by mouth every 12 (twelve) hours as needed. For muscle spasms      doxepin 50 MG capsule   Commonly known as: SINEQUAN   Take 50 mg by mouth at bedtime.      esomeprazole 40 MG capsule   Commonly known as: NEXIUM   Take 40 mg by mouth daily before breakfast.      hydrochlorothiazide 12.5 MG capsule   Commonly known as: MICROZIDE   Take 12.5 mg by mouth at bedtime.      HYDROcodone-acetaminophen 10-325 MG per tablet   Commonly known as: NORCO   Take 1 tablet by mouth every 8 (eight) hours as needed. For pain      lamoTRIgine 25 MG tablet   Commonly known as: LAMICTAL   Take 50 mg by mouth at bedtime.      metoprolol succinate 50 MG 24 hr tablet   Commonly known as: TOPROL-XL   Take 50 mg by mouth at bedtime. Take with or immediately following a meal.      multivitamin with minerals Tabs   Take 1 tablet by mouth at bedtime.      PROBIOTIC DAILY PO   Take 1 tablet by mouth at bedtime.         Signed: Georgiann Cocker 12/10/2011, 10:16 AM

## 2011-12-10 NOTE — Progress Notes (Signed)
Subjective: Patient reports "I feel ok"  Objective: Vital signs in last 24 hours: Temp:  [97 F (36.1 C)-99.6 F (37.6 C)] 99.5 F (37.5 C) (11/15 0754) Pulse Rate:  [50-88] 86  (11/15 0754) Resp:  [9-18] 16  (11/15 0754) BP: (97-129)/(63-86) 97/70 mmHg (11/15 0754) SpO2:  [93 %-100 %] 96 % (11/15 0754)  Intake/Output from previous day: 11/14 0701 - 11/15 0700 In: 1700 [I.V.:1700] Out: 50 [Blood:50] Intake/Output this shift:    Alert, conversant. Reports posterior neck & trapezius pain - reassured. Drsg without drainage, removed. Steri's intact. No erythema, swelling, or drainage. Good strength BUE.  Lab Results: No results found for this basename: WBC:2,HGB:2,HCT:2,PLT:2 in the last 72 hours BMET No results found for this basename: NA:2,K:2,CL:2,CO2:2,GLUCOSE:2,BUN:2,CREATININE:2,CALCIUM:2 in the last 72 hours  Studies/Results: Dg Chest 2 View  12/09/2011  *RADIOLOGY REPORT*  Clinical Data: Hypertension  CHEST - 2 VIEW  Comparison: None.  Findings: The heart and pulmonary vascularity are within normal limits.  The lungs are clear bilaterally.  No acute bony abnormality is noted.  IMPRESSION: No acute abnormality seen.   Original Report Authenticated By: Alcide Clever, M.D.    Dg Cervical Spine 1 View  12/09/2011  *RADIOLOGY REPORT*  Clinical Data: Neck pain, pseudarthrosis.  DG C-ARM 1-60 MIN,DG CERVICAL SPINE - 1 VIEW  Comparison: Plain films Dr. Fredrich Birks office 05/12/2011.  Findings: C-arm films document C5-C6 posterior fusion. Limited views show no definite adverse features.  IMPRESSION: As above.   Original Report Authenticated By: Davonna Belling, M.D.    Dg C-arm 1-60 Min  12/09/2011  *RADIOLOGY REPORT*  Clinical Data: Neck pain, pseudarthrosis.  DG C-ARM 1-60 MIN,DG CERVICAL SPINE - 1 VIEW  Comparison: Plain films Dr. Fredrich Birks office 05/12/2011.  Findings: C-arm films document C5-C6 posterior fusion. Limited views show no definite adverse features.  IMPRESSION: As above.    Original Report Authenticated By: Davonna Belling, M.D.     Assessment/Plan: Improving  LOS: 1 day  Per Dr. Venetia Maxon, d/c IV, d/c to home. Pt will visit office next week for scalp staple removal & schedule3-4 week f/u appt as well.  Pt & husband verbalize understanding of d/c instructions. Rx's to Pt: Percocet 10/325 1 po q4-6 hrs prn pain #60; Valium 5mg  1-2 po q8hrs prn spasm #60; Phenergan 12.5mg  1-2 po q6hrs prn nausea/vomiting #20.    Georgiann Cocker 12/10/2011, 10:09 AM

## 2011-12-10 NOTE — Progress Notes (Signed)
As above.

## 2011-12-10 NOTE — Progress Notes (Signed)
PT Cancellation Note  Patient Details Name: Stacy Johnston MRN: [id number] DOB: 04-05-1965   Cancelled Treatment:    Reason Eval/Treat Not Completed: Other (comment) (pt already dc'd home)   [address] [employer] 12/10/2011, 11:49 AM

## 2011-12-10 NOTE — Discharge Summary (Signed)
As above.

## 2011-12-10 NOTE — Evaluation (Signed)
Occupational Therapy Evaluation Patient Details Name: Stacy Johnston MRN: [id number] DOB: 1965-12-16 Today's Date: 12/10/2011 Time: 4098-1191 OT Time Calculation (min): 20 min  OT Assessment / Plan / Recommendation Clinical Impression  Pt is a 46 yr old female admitted for C5-C6 cervical fusion.  Overall independent with mobility and supervision with selfcare tasks.  Will have initial 24 hour supervision from her husband until Monday.  No further acute or post acute OT needs.      OT Assessment  Patient does not need any further OT services    Follow Up Recommendations  No OT follow up       Equipment Recommendations  None recommended by OT          Precautions / Restrictions Precautions Precautions: Cervical Restrictions Weight Bearing Restrictions: No   Pertinent Vitals/Pain Pain 3-4/10 meds given per schedule    ADL  Eating/Feeding: Simulated;Independent Where Assessed - Eating/Feeding: Edge of bed Grooming: Simulated;Independent Where Assessed - Grooming: Supported standing Upper Body Bathing: Simulated;Set up Where Assessed - Upper Body Bathing: Unsupported sitting Lower Body Bathing: Simulated;Supervision/safety Where Assessed - Lower Body Bathing: Supported sit to stand Upper Body Dressing: Simulated;Set up Where Assessed - Upper Body Dressing: Unsupported sitting Lower Body Dressing: Simulated;Set up Where Assessed - Lower Body Dressing: Unsupported sit to stand Toilet Transfer: Simulated;Supervision/safety Toilet Transfer Method: Stand pivot Toilet Transfer Equipment: Regular height toilet Toileting - Clothing Manipulation and Hygiene: Simulated;Supervision/safety Where Assessed - Engineer, mining and Hygiene: Sit to stand from 3-in-1 or toilet Tub/Shower Transfer: Simulated;Supervision/safety Tub/Shower Transfer Method: Ambulating Equipment Used: Other (comment) (cervical collar) Transfers/Ambulation Related to ADLs: Pt is overall independent  for mobility with ambulation, without assistive device. ADL Comments: Pt and spouse educated on cervical precautions.  No overhead reaching, no lifting more than 5 pounds.  Educated pt to use reacher for picking items that are dropped.  Pt is somewhat concerned about being able to negotiate the steps.  Will practice with PT.      Visit Information  Last OT Received On: 12/10/11 Assistance Needed: +1    Subjective Data  Subjective: "Do you think I can get up and down the steps." Patient Stated Goal: To get rid of her pain.   Prior Functioning     Home Living Lives With: Spouse Available Help at Discharge: Available 24 hours/day;Other (Comment) (Husband back to work on 11/18) Type of Home: House Home Access: Stairs to enter [employer] of Steps: 1 Entrance Stairs-Rails: None Home Layout: Two level;Other (Comment) (main level is upper floor.  Lower level has bedrooms) Alternate Level Stairs-Number of Steps: 12           bedrooms are downstairs and the main floor has the kitchen and living room. 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 Adaptive Equipment: None Prior Function Level of Independence: Independent Able to Take Stairs?: Reciprically Driving: Yes Vocation: Full time employment Communication Communication: No difficulties Dominant Hand: Right         Vision/Perception Vision - Assessment Vision Assessment: Vision not tested Perception Perception: Within Functional Limits Praxis Praxis: Intact   Cognition  Overall Cognitive Status: Appears within functional limits for tasks assessed/performed Arousal/Alertness: Awake/alert Orientation Level: Appears intact for tasks assessed Behavior During Session: [facility] for tasks performed    Extremity/Trunk Assessment Right Upper Extremity Assessment RUE ROM/Strength/Tone: Unable to fully assess;Due to  precautions RUE Sensation: WFL - Light Touch RUE Coordination: WFL - gross/fine motor Left Upper Extremity Assessment  LUE ROM/Strength/Tone: Unable to fully assess;Due to precautions;Deficits LUE ROM/Strength/Tone Deficits: AROM WFLS for shoulder flexion to 90 degrees and all other joints WFLs. LUE Sensation: WFL - Light Touch LUE Coordination: WFL - gross/fine motor Trunk Assessment Trunk Assessment: Normal     Mobility Bed Mobility Bed Mobility: Supine to Sit Supine to Sit: 5: Supervision;HOB elevated Transfers Transfers: Sit to Stand Sit to Stand: Without upper extremity assist;From bed;7: Independent           Balance Balance Balance Assessed: Yes Dynamic Standing Balance Dynamic Standing - Level of Assistance: 7: Independent   End of Session OT - End of Session Activity Tolerance: Patient limited by pain Patient left: in bed;with call bell/phone within reach;with family/visitor present Nurse Communication: Mobility status  GO Functional Assessment Tool Used: clinical judgement Functional Limitation: Self care Self Care Current Status (W0981): At least 1 percent but less than 20 percent impaired, limited or restricted Self Care Goal Status (X9147): At least 1 percent but less than 20 percent impaired, limited or restricted Self Care Discharge Status [phone]): At least 1 percent but less than 20 percent impaired, limited or restricted   Grete Bosko OTR/L Pager number [phone] 12/10/2011, 10:02 AM

## 2011-12-10 NOTE — Progress Notes (Signed)
Pt given D/C instructions with Rx's, verbal understanding given. Pt D/C'd home via wheelchair @ 1100 per MD order. Evania Lyne, RN 

## 2011-12-13 ENCOUNTER — Encounter (HOSPITAL_COMMUNITY): Payer: Self-pay | Admitting: Neurosurgery

## 2011-12-14 ENCOUNTER — Encounter (HOSPITAL_COMMUNITY): Payer: Self-pay | Admitting: *Deleted

## 2011-12-14 ENCOUNTER — Inpatient Hospital Stay (HOSPITAL_COMMUNITY)
Admission: AD | Admit: 2011-12-14 | Discharge: 2011-12-16 | DRG: 418 | Disposition: A | Discharge status: Home / Self Care | Payer: BC Managed Care – PPO | Source: Ambulatory Visit | Attending: Neurosurgery | Admitting: Neurosurgery

## 2011-12-14 DIAGNOSIS — G8918 Other acute postprocedural pain: Secondary | ICD-10-CM | POA: Diagnosis present

## 2011-12-14 DIAGNOSIS — Y92009 Unspecified place in unspecified non-institutional (private) residence as the place of occurrence of the external cause: Secondary | ICD-10-CM

## 2011-12-14 DIAGNOSIS — Z79899 Other long term (current) drug therapy: Secondary | ICD-10-CM

## 2011-12-14 DIAGNOSIS — M542 Cervicalgia: Secondary | ICD-10-CM | POA: Diagnosis present

## 2011-12-14 DIAGNOSIS — Z87891 Personal history of nicotine dependence: Secondary | ICD-10-CM

## 2011-12-14 DIAGNOSIS — Z886 Allergy status to analgesic agent status: Secondary | ICD-10-CM

## 2011-12-14 DIAGNOSIS — Y831 Surgical operation with implant of artificial internal device as the cause of abnormal reaction of the patient, or of later complication, without mention of misadventure at the time of the procedure: Secondary | ICD-10-CM | POA: Diagnosis present

## 2011-12-14 DIAGNOSIS — T8140XA Infection following a procedure, unspecified, initial encounter: Principal | ICD-10-CM | POA: Diagnosis present

## 2011-12-14 LAB — CBC WITH DIFFERENTIAL/PLATELET
Eosinophils Relative: 6 % — ABNORMAL HIGH (ref 0–5)
HCT: 44.3 % (ref 36.0–46.0)
Lymphocytes Relative: 31 % (ref 12–46)
Lymphs Abs: 2.5 10*3/uL (ref 0.7–4.0)
MCV: 91.2 fL (ref 78.0–100.0)
Monocytes Absolute: 0.8 10*3/uL (ref 0.1–1.0)
RBC: 4.86 MIL/uL (ref 3.87–5.11)
WBC: 8.2 10*3/uL (ref 4.0–10.5)

## 2011-12-14 LAB — COMPREHENSIVE METABOLIC PANEL
Albumin: 3.7 g/dL (ref 3.5–5.2)
BUN: 8 mg/dL (ref 6–23)
Calcium: 9.6 mg/dL (ref 8.4–10.5)
Creatinine, Ser: 0.58 mg/dL (ref 0.50–1.10)
Total Protein: 8.1 g/dL (ref 6.0–8.3)

## 2011-12-14 MED ORDER — LAMOTRIGINE 25 MG PO TABS: 50.0000 mg | ORAL_TABLET | Freq: Every day | ORAL | Status: AC

## 2011-12-14 MED ORDER — ACETAMINOPHEN 650 MG RE SUPP: 650.0000 mg | Freq: Four times a day (QID) | RECTAL | Status: AC | PRN

## 2011-12-14 MED ORDER — KCL IN DEXTROSE-NACL 20-5-0.45 MEQ/L-%-% IV SOLN
INTRAVENOUS | Status: DC
  Administered 2011-12-14: 16:00:00 via INTRAVENOUS
  Filled 2011-12-14: qty 1000

## 2011-12-14 MED ORDER — DOCUSATE SODIUM 100 MG PO CAPS: 100.0000 mg | ORAL_CAPSULE | Freq: Two times a day (BID) | ORAL | Status: DC

## 2011-12-14 MED ORDER — KCL IN DEXTROSE-NACL 20-5-0.45 MEQ/L-%-% IV SOLN
INTRAVENOUS | Status: DC
  Administered 2011-12-14 – 2011-12-16 (×2): via INTRAVENOUS
  Filled 2011-12-14 (×6): qty 1000

## 2011-12-14 MED ORDER — ONDANSETRON HCL 4 MG/2ML IJ SOLN: 4.0000 mg | Freq: Four times a day (QID) | INTRAMUSCULAR | Status: AC | PRN

## 2011-12-14 MED ORDER — HYDROMORPHONE HCL 2 MG PO TABS: 2.0000 mg | ORAL_TABLET | ORAL | Status: AC | PRN

## 2011-12-14 MED ORDER — DIAZEPAM 5 MG PO TABS
5.0000 mg | ORAL_TABLET | Freq: Three times a day (TID) | ORAL | Status: DC | PRN
  Administered 2011-12-14: 5 mg via ORAL
  Administered 2011-12-14: 10 mg via ORAL
  Administered 2011-12-15 – 2011-12-16 (×5): 5 mg via ORAL
  Administered 2011-12-16: 10 mg via ORAL
  Filled 2011-12-14: qty 2
  Filled 2011-12-14 (×2): qty 1
  Filled 2011-12-14 (×3): qty 2
  Filled 2011-12-14 (×2): qty 1

## 2011-12-14 MED ORDER — DOXEPIN HCL 50 MG PO CAPS: 50.0000 mg | ORAL_CAPSULE | Freq: Every day | ORAL | Status: AC

## 2011-12-14 MED ORDER — ACETAMINOPHEN 325 MG PO TABS: 650.0000 mg | ORAL_TABLET | Freq: Four times a day (QID) | ORAL | Status: AC | PRN

## 2011-12-14 MED ORDER — PANTOPRAZOLE SODIUM 40 MG PO TBEC: 40.0000 mg | DELAYED_RELEASE_TABLET | Freq: Every day | ORAL | Status: AC

## 2011-12-14 MED ORDER — VANCOMYCIN HCL IN DEXTROSE 1-5 GM/200ML-% IV SOLN
1000.0000 mg | Freq: Three times a day (TID) | INTRAVENOUS | Status: DC
  Administered 2011-12-14 – 2011-12-16 (×6): 1000 mg via INTRAVENOUS
  Filled 2011-12-14 (×9): qty 200

## 2011-12-14 MED ORDER — METOPROLOL SUCCINATE ER 50 MG PO TB24
50.0000 mg | ORAL_TABLET | Freq: Every day | ORAL | Status: DC
  Administered 2011-12-14: 50 mg via ORAL
  Filled 2011-12-14 (×3): qty 1

## 2011-12-14 MED ORDER — HYDROCHLOROTHIAZIDE 12.5 MG PO CAPS: 12.5000 mg | ORAL_CAPSULE | Freq: Every day | ORAL | Status: AC

## 2011-12-14 MED ORDER — ACETAMINOPHEN 325 MG PO TABS: 650.0000 mg | ORAL_TABLET | Freq: Four times a day (QID) | ORAL | Status: DC | PRN

## 2011-12-14 MED ORDER — SENNA 8.6 MG PO TABS: 2.0000 | ORAL_TABLET | Freq: Every evening | ORAL | Status: DC | PRN

## 2011-12-14 MED ORDER — ONDANSETRON HCL 4 MG/2ML IJ SOLN: 4.0000 mg | Freq: Four times a day (QID) | INTRAMUSCULAR | Status: DC | PRN

## 2011-12-14 MED ORDER — KCL IN DEXTROSE-NACL 20-5-0.45 MEQ/L-%-% IV SOLN: INTRAVENOUS | Status: AC

## 2011-12-14 MED ORDER — OXYCODONE HCL 5 MG PO TABS: 5.0000 mg | ORAL_TABLET | ORAL | Status: DC | PRN

## 2011-12-14 MED ORDER — LAMOTRIGINE 25 MG PO TABS
50.0000 mg | ORAL_TABLET | Freq: Every day | ORAL | Status: DC
  Administered 2011-12-14 – 2011-12-15 (×2): 50 mg via ORAL
  Filled 2011-12-14 (×3): qty 2

## 2011-12-14 MED ORDER — DOXEPIN HCL 50 MG PO CAPS
50.0000 mg | ORAL_CAPSULE | Freq: Every day | ORAL | Status: DC
  Administered 2011-12-14 – 2011-12-15 (×2): 50 mg via ORAL
  Filled 2011-12-14 (×3): qty 1

## 2011-12-14 MED ORDER — OXYCODONE-ACETAMINOPHEN 5-325 MG PO TABS: 1.0000 | ORAL_TABLET | ORAL | Status: AC | PRN

## 2011-12-14 MED ORDER — METOPROLOL SUCCINATE ER 50 MG PO TB24: 50.0000 mg | ORAL_TABLET | Freq: Every day | ORAL | Status: AC

## 2011-12-14 MED ORDER — DIAZEPAM 5 MG PO TABS: 5.0000 mg | ORAL_TABLET | Freq: Three times a day (TID) | ORAL | Status: AC | PRN

## 2011-12-14 MED ORDER — ACETAMINOPHEN 650 MG RE SUPP: 650.0000 mg | Freq: Four times a day (QID) | RECTAL | Status: DC | PRN

## 2011-12-14 MED ORDER — DOCUSATE SODIUM 100 MG PO CAPS: 100.0000 mg | ORAL_CAPSULE | Freq: Two times a day (BID) | ORAL | Status: AC

## 2011-12-14 MED ORDER — OXYCODONE-ACETAMINOPHEN 5-325 MG PO TABS
2.0000 | ORAL_TABLET | ORAL | Status: DC | PRN
  Administered 2011-12-14 – 2011-12-16 (×11): 2 via ORAL
  Filled 2011-12-14 (×12): qty 2

## 2011-12-14 MED ORDER — HYDROCHLOROTHIAZIDE 12.5 MG PO CAPS
12.5000 mg | ORAL_CAPSULE | Freq: Every day | ORAL | Status: DC
  Administered 2011-12-14: 12.5 mg via ORAL
  Filled 2011-12-14 (×3): qty 1

## 2011-12-14 MED ORDER — SENNOSIDES-DOCUSATE SODIUM 8.6-50 MG PO TABS: 2.0000 | ORAL_TABLET | Freq: Every evening | ORAL | Status: AC | PRN

## 2011-12-14 NOTE — Progress Notes (Signed)
ANTIBIOTIC CONSULT NOTE - INITIAL  Pharmacy Consult for vancomycin Indication: post-op wound infection of posterior cervical incision   Allergies  Allergen Reactions  . Dilaudid (Hydromorphone Hcl) Itching  . Nsaids Other (See Comments)    Chest pain    Patient Measurements: Height: 5\' 2"  (157.5 cm) Weight: 167 lb 5.3 oz (75.9 kg) IBW/kg (Calculated) : 50.1  Vital Signs: Temp: 97.9 F (36.6 C) (11/19 1203) Temp src: Oral (11/19 1203) BP: 133/91 mmHg (11/19 1203) Pulse Rate: 77  (11/19 1203) Intake/Output from previous day:   Intake/Output from this shift:    Labs:  Basename 12/14/11 1305  WBC 8.2  HGB 15.7*  PLT 326  LABCREA --  CREATININE --   CrCl is unknown because no creatinine reading has been taken. No results found for this basename: VANCOTROUGH:2,VANCOPEAK:2,VANCORANDOM:2,GENTTROUGH:2,GENTPEAK:2,GENTRANDOM:2,TOBRATROUGH:2,TOBRAPEAK:2,TOBRARND:2,AMIKACINPEAK:2,AMIKACINTROU:2,AMIKACIN:2, in the last 72 hours   Microbiology: Recent Results (from the past 720 hour(s))  SURGICAL PCR SCREEN     Status: Abnormal   Collection Time   11/26/11  3:31 PM      Component Value Range Status Comment   MRSA, PCR NEGATIVE  NEGATIVE Final    Staphylococcus aureus POSITIVE (*) NEGATIVE Final     Medical History: Past Medical History  Diagnosis Date  . PONV (postoperative nausea and vomiting)   . Hypertension     takes meds daily  . Dysrhythmia     PVCs  . UTI (lower urinary tract infection)   . GERD (gastroesophageal reflux disease)   . Vertigo   . Cervical dysphagia   . Phlebitis age 46    Left Calf.     Medications:  Prescriptions prior to admission  Medication Sig Dispense Refill  . ALPRAZolam (XANAX) 1 MG tablet Take 1 mg by mouth at bedtime.       . diazepam (VALIUM) 5 MG tablet Take 5 mg by mouth every 8 (eight) hours as needed. For muscle spasms      . docusate sodium (COLACE) 100 MG capsule Take 100 mg by mouth 2 (two) times daily as needed. For stool  softner      . doxepin (SINEQUAN) 50 MG capsule Take 50 mg by mouth at bedtime.      Marland Kitchen esomeprazole (NEXIUM) 40 MG capsule Take 40 mg by mouth daily before breakfast.      . hydrochlorothiazide (MICROZIDE) 12.5 MG capsule Take 12.5 mg by mouth at bedtime.       Marland Kitchen HYDROcodone-acetaminophen (NORCO) 10-325 MG per tablet Take 1 tablet by mouth every 8 (eight) hours as needed. For pain      . lamoTRIgine (LAMICTAL) 25 MG tablet Take 50 mg by mouth at bedtime.       . metoprolol succinate (TOPROL-XL) 50 MG 24 hr tablet Take 50 mg by mouth at bedtime. Take with or immediately following a meal.      . Multiple Vitamin (MULTIVITAMIN WITH MINERALS) TABS Take 1 tablet by mouth at bedtime.       . Probiotic Product (PROBIOTIC DAILY PO) Take 1 tablet by mouth at bedtime.       Assessment: Stacy Johnston is a 46 yo F admitted with posterior cervical incision pain, swelling, and drainage x3days. She underwent posterior cervical fusion on last Thursday Nov 14th. Her weight is 75.9 kg.  No creatinine available - a cmet has been ordered. Will assume renal function is normal.  She is to start vancomycin for post-op wound infection of posterior cervical incision.  Her WBC is 8.2. She is currently afebrile.  Per pt she has spiked a temp of 99.8 the past 2 evenings.     Goal of Therapy:  Vancomycin trough level 10-15 mcg/ml  Plan:  1. Vancomycin 1000 mg IV q8h 2. F/u creatinine Herby Abraham, Pharm.D. [phone] 12/14/2011 1:44 PM

## 2011-12-14 NOTE — Progress Notes (Signed)
Given normal WBC and lack of fever, I doubt significant infection.  Will give iv vancomycin and await cultures.

## 2011-12-14 NOTE — H&P (Signed)
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 She underwent posterior cervical fusion on last Thursday Nov 14th.  REVIEW OF SYSTEMS: Review of Systems was reviewed with the patient. Pertinent positives include high blood pressure, occasional irregular pulse and cervical and bilateral arm pain.  PAST MEDICAL HISTORY:    Current Medical Conditions: As previously described.     Prior Operations and Hospitalizations: C-section, septoplasty, gallbladder, anterior cervical decompression and fusion C5-6 on 10/02/10, Posterior Cervical fusion C5-6 12-09-11.     Medications and Allergies: Medications include Nexium 40 mg qam, Doxepin 50 mg qhs, HCTZ 12.5 mg qhs, Toprol XL 50 mg qhs, Lamictal 50 mg qhs, Probiotics qhs, Alprazolam 1 mg qhs, multivitamin qhs. Percocet 5/325 1-2 po q4hrs prn pain, Valium 5mg  1-2 po q8hrs prn spasm. ASPIRIN CAUSES CHEST PAIN.   Height and Weight: She is 5', 3" tall and 166 lbs.  FAMILY HISTORY: Both parents are 26 and in good health.   SOCIAL HISTORY: She says that she stopped smoking last year and is a social drinker of alcoholic beverages. She denies any history of drug abuse.   PHYSICAL EXAMINATION:    General Appearance: Stacy Johnston is a pleasant, cooperative woman in no acute distress.     Blood Pressure, Pulse and Respiratory Rate: Her blood pressure is 124/78. Heart rate is 72 and regular. Respiratory rate is 18.     HEENT - normocephalic, atraumatic. The pupils are equal, round and reactive to light. The extraocular muscles are intact. Sclerae - white. Conjunctiva - pink. Oropharynx benign. Uvula midline.     Neck - normal     Respiratory - there is normal respiratory effort with good intercostal function. Lungs are clear to auscultation. There are no rales,  rhonchi or wheezes.     Cardiovascular - the heart has regular rate and rhythm to auscultation. No murmurs are appreciated. There is no extremity edema, cyanosis or clubbing. There are palpable pedal pulses.     Abdomen - soft, nontender, no hepatosplenomegaly appreciated or masses. There are active bowel sounds. No guarding or rebound.     Musculoskeletal Examination - she is able to stand on her heels and toes and able to bend to touch her toes. She has pain at the lumbosacral junction. She has negative seated straight leg raise bilaterally.  NEUROLOGICAL EXAMINATION: The patient is oriented to time, person and place and has good recall of both recent and remote memory with normal attention span and concentration. The patient speaks with clear and fluent speech and exhibits normal language function and appropriate fund of knowledge.    Cranial Nerve Examination - pupils are equal, round and reactive to light. Extraocular movements are full. Visual fields are full to confrontational testing. Facial sensation and facial movement are symmetric and intact. Hearing is intact to finger rub. Palate is upgoing. Shoulder shrug is symmetric. Tongue protrudes in the midline.  Motor Examination - motor strength is 5/5 in the bilateral upper extremities with the exception of left biceps strength at 5/5, left wrist extension strength at 5/5. In the lower extremities motor strength is 5/5 in hip flexion, extension, quadriceps, hamstrings, plantar flexion, dorsiflexion and extensor hallucis longus.    Sensory Examination - she has decreased pin sensation in a left C5 distribution.     Deep Tendon Reflexes - 2 in the  biceps, triceps and brachioradialis, 2 at the knees, 2 at the ankles and great toes are downgoing to plantar stimulation.     Cerebellar Examination - normal coordination in upper and lower extremities and normal rapid alternating movements. Romberg test is negative.  IMPRESSION AND RECOMMENDATIONS:     Stacy Johnston is a 46 year old woman with four days post-op posterior cervical fusion incison swelling, drainage, and pain. She notes for the last two days she has spiked a temp of 99.8 each evening. (Currently 97.8) The incision is erythematous with swelling and thin blood-tinged drainage. She has been evaluated by Dr. Coletta Memos in Dr. Fredrich Birks absence; and admission to [facility] was recommended. Stacy Johnston agrees to this course of action. Dr. Venetia Maxon will visit upon completion of his current OR case, once Stacy Johnston is in her room.   Georgiann Cocker, RN, BSN Per my evaluation, there is some induration of the wound and serosanguinous drainage, but this does not appear to be infected.  Will observe for fever, WBC.  Wound cultures pending.  Will start on Vancomycin until results available.

## 2011-12-14 NOTE — H&P (Signed)
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 She underwent posterior cervical fusion on last Thursday Nov 14th.  REVIEW OF SYSTEMS: Review of Systems was reviewed with the patient. Pertinent positives include high blood pressure, occasional irregular pulse and cervical and bilateral arm pain.  PAST MEDICAL HISTORY:    Current Medical Conditions: As previously described.    Prior Operations and Hospitalizations: C-section, septoplasty, gallbladder, anterior cervical decompression and fusion C5-6 on 10/02/10, Posterior Cervical fusion C5-6 12-09-11.    Medications and Allergies: Medications include Nexium 40 mg qam, Doxepin 50 mg qhs, HCTZ 12.5 mg qhs, Toprol XL 50 mg qhs, Lamictal 50 mg qhs, Probiotics qhs, Alprazolam 1 mg qhs, multivitamin qhs. Percocet 5/325 1-2 po q4hrs prn pain, Valium 5mg  1-2 po q8hrs prn spasm. ASPIRIN CAUSES CHEST PAIN.    Height and Weight: She is 5', 3" tall and 166 lbs.  FAMILY HISTORY: Both parents are 48 and in good health.  SOCIAL HISTORY: She says that she stopped smoking last year and is a social drinker of alcoholic beverages. She denies any history of drug abuse.  PHYSICAL EXAMINATION:    General Appearance: Stacy Johnston is a pleasant, cooperative woman in no acute distress.    Blood Pressure, Pulse and Respiratory Rate: Her blood pressure is 124/78. Heart rate is 72 and regular. Respiratory rate is 18.    HEENT - normocephalic, atraumatic. The pupils are equal, round and reactive to light. The extraocular muscles are intact. Sclerae - white. Conjunctiva - pink. Oropharynx benign. Uvula midline.    Neck - normal    Respiratory - there is normal respiratory effort with good intercostal function. Lungs are clear to auscultation. There are no rales, rhonchi or wheezes.      Cardiovascular - the heart has regular rate and rhythm to auscultation. No murmurs are appreciated. There is no extremity edema, cyanosis or clubbing. There are palpable pedal pulses.    Abdomen - soft, nontender, no hepatosplenomegaly appreciated or masses. There are active bowel sounds. No guarding or rebound.    Musculoskeletal Examination - she is able to stand on her heels and toes and able to bend to touch her toes. She has pain at the lumbosacral junction. She has negative seated straight leg raise bilaterally.  NEUROLOGICAL EXAMINATION: The patient is oriented to time, person and place and has good recall of both recent and remote memory with normal attention span and concentration. The patient speaks with clear and fluent speech and exhibits normal language function and appropriate fund of knowledge.    Cranial Nerve Examination - pupils are equal, round and reactive to light. Extraocular movements are full. Visual fields are full to confrontational testing. Facial sensation and facial movement are symmetric and intact. Hearing is intact to finger rub. Palate is upgoing. Shoulder shrug is symmetric. Tongue protrudes in the midline.  Motor Examination - motor strength is 5/5 in the bilateral upper extremities with the exception of left biceps strength at 5/5, left wrist extension strength at 5/5. In the lower extremities motor strength is 5/5 in hip flexion, extension, quadriceps, hamstrings, plantar flexion, dorsiflexion and extensor hallucis longus.    Sensory Examination - she has decreased pin sensation in a left C5 distribution.    Deep Tendon Reflexes - 2 in the biceps, triceps and brachioradialis, 2 at the knees, 2 at the  ankles and great toes are downgoing to plantar stimulation.    Cerebellar Examination - normal coordination in upper and lower extremities and normal rapid alternating movements. Romberg test is negative.  IMPRESSION AND RECOMMENDATIONS:  Stacy Johnston is a 46 year old  woman with four days post-op posterior cervical fusion incison swelling, drainage, and pain. She notes for the last two days she has spiked a temp of 99.8 each evening. (Currently 97.8) The incision is erythematous with swelling and thin blood-tinged drainage. She has been evaluated by Dr. Coletta Memos in Dr. Fredrich Birks absence; and admission to [facility] was recommended. Stacy Johnston agrees to this course of action. Dr. Venetia Maxon will visit upon completion of his current OR case, once Mrs. Rini is in her room.

## 2011-12-14 NOTE — Progress Notes (Signed)
Patient ID: Stacy Johnston, female   DOB: March 26, 1965, 46 y.o.   MRN: [id number]  Alert, conversant - much less anxious this afternoon. Pain controlled at present. Reassured. Will reassess labs & temp in am. Proceeding with Vancomycin per pharmacy at present.   Georgiann Cocker, RN, BSN

## 2011-12-15 LAB — CBC WITH DIFFERENTIAL/PLATELET
Eosinophils Relative: 6 % — ABNORMAL HIGH (ref 0–5)
HCT: 39.9 % (ref 36.0–46.0)
Hemoglobin: 14.2 g/dL (ref 12.0–15.0)
Lymphocytes Relative: 35 % (ref 12–46)
Lymphs Abs: 3 10*3/uL (ref 0.7–4.0)
MCH: 32.2 pg (ref 26.0–34.0)
MCV: 90.5 fL (ref 78.0–100.0)
Monocytes Absolute: 0.9 10*3/uL (ref 0.1–1.0)
Monocytes Relative: 10 % (ref 3–12)
RBC: 4.41 MIL/uL (ref 3.87–5.11)
WBC: 8.6 10*3/uL (ref 4.0–10.5)

## 2011-12-15 MED ORDER — BISACODYL 10 MG RE SUPP
10.0000 mg | Freq: Every day | RECTAL | Status: DC | PRN
  Administered 2011-12-16: 10 mg via RECTAL
  Filled 2011-12-15: qty 1

## 2011-12-15 MED ORDER — SENNOSIDES-DOCUSATE SODIUM 8.6-50 MG PO TABS
2.0000 | ORAL_TABLET | Freq: Every evening | ORAL | Status: DC | PRN
  Administered 2011-12-15: 2 via ORAL
  Filled 2011-12-15: qty 2

## 2011-12-15 MED ORDER — FLEET ENEMA 7-19 GM/118ML RE ENEM: 1.0000 | ENEMA | Freq: Every day | RECTAL | Status: DC | PRN

## 2011-12-15 MED ORDER — DOCUSATE SODIUM 100 MG PO CAPS
100.0000 mg | ORAL_CAPSULE | Freq: Every day | ORAL | Status: DC
  Administered 2011-12-15 – 2011-12-16 (×2): 100 mg via ORAL
  Filled 2011-12-15 (×2): qty 1

## 2011-12-15 NOTE — Progress Notes (Signed)
Patient continuous to have drainage from the incision site. Changed the dressing twice during the shift.

## 2011-12-15 NOTE — Progress Notes (Signed)
Subjective: Patient reports "I just wish the burning would stop"  Objective: Vital signs in last 24 hours: Temp:  [97.6 F (36.4 C)-98.9 F (37.2 C)] 97.7 F (36.5 C) (11/20 0530) Pulse Rate:  [64-106] 64  (11/20 0530) Resp:  [18-20] 18  (11/20 0530) BP: (98-133)/(68-91) 103/70 mmHg (11/20 0530) SpO2:  [95 %-100 %] 100 % (11/20 0530) Weight:  [75.9 kg (167 lb 5.3 oz)] 75.9 kg (167 lb 5.3 oz) (11/19 1203)  Intake/Output from previous day: 11/19 0701 - 11/20 0700 In: 1723.8 [I.V.:1123.8; IV Piggyback:600] Out: -  Intake/Output this shift: Total I/O In: 120 [P.O.:120] Out: -   Alert, conversant. Reports burning pain at incision persists. Thin blood tinged drainage continues. Incision remains well-approximated with minimal erythema. Afebrile. WBC 8.2. Culture showing no growth x1 day.  Lab Results:  Basename 12/15/11 0520 12/14/11 1305  WBC 8.6 8.2  HGB 14.2 15.7*  HCT 39.9 44.3  PLT 298 326   BMET  Basename 12/14/11 1305  NA 137  K 3.4*  CL 95*  CO2 32  GLUCOSE 102*  BUN 8  CREATININE 0.58  CALCIUM 9.6    Studies/Results: No results found.  Assessment/Plan: Stable, on Vancomycin.  LOS: 1 day  Will work on bowels today and continue to mobilize. Monitoring temp, labs, & incision/drainage.   Stacy Johnston 12/15/2011, 8:38 AM

## 2011-12-15 NOTE — Progress Notes (Signed)
As above.

## 2011-12-16 LAB — CBC WITH DIFFERENTIAL/PLATELET
Basophils Absolute: 0.1 10*3/uL (ref 0.0–0.1)
Eosinophils Absolute: 0.5 10*3/uL (ref 0.0–0.7)
Eosinophils Relative: 8 % — ABNORMAL HIGH (ref 0–5)
HCT: 37.6 % (ref 36.0–46.0)
Lymphocytes Relative: 39 % (ref 12–46)
MCH: 32 pg (ref 26.0–34.0)
MCHC: 35.9 g/dL (ref 30.0–36.0)
MCV: 89.1 fL (ref 78.0–100.0)
Monocytes Absolute: 0.5 10*3/uL (ref 0.1–1.0)
RDW: 12.2 % (ref 11.5–15.5)
WBC: 5.8 10*3/uL (ref 4.0–10.5)

## 2011-12-16 LAB — WOUND CULTURE

## 2011-12-16 NOTE — Progress Notes (Signed)
Subjective: Patient reports "I feel alright until I stand up; then the incision just burns so bad"  Objective: Vital signs in last 24 hours: Temp:  [97.9 F (36.6 C)-98.2 F (36.8 C)] 97.9 F (36.6 C) (11/21 0600) Pulse Rate:  [76-92] 76  (11/21 0600) Resp:  [18] 18  (11/21 0600) BP: (118-122)/(75-85) 118/75 mmHg (11/21 0600) SpO2:  [96 %-98 %] 98 % (11/21 0600)  Intake/Output from previous day: 11/20 0701 - 11/21 0700 In: 520 [P.O.:120; IV Piggyback:400] Out: -  Intake/Output this shift: Total I/O In: 240 [P.O.:240] Out: -   Alert, conversant. Reports pain well controlled when shoulders at rest, pain at incision when out of bed. Incision looks good. Muscle spasm causes tightness along incision as expected. Thin drainage persisted overnight (dry at present). Good strength BUE remains. Labs WNL. Afebrile.  Lab Results:  [facility] 12/16/11 0705 12/15/11 0520  WBC 5.8 8.6  HGB 13.5 14.2  HCT 37.6 39.9  PLT 276 298   BMET  Basename 12/14/11 1305  NA 137  K 3.4*  CL 95*  CO2 32  GLUCOSE 102*  BUN 8  CREATININE 0.58  CALCIUM 9.6    Studies/Results: No results found.  Assessment/Plan: Improving  LOS: 2 days  Discussed with pt likely d/c on oral antibiotics after evaluation by Dr. Venetia Maxon. Return office visit before Christmas.   (LATE ENTRY>>>Visited at 0900)  Zacary Bauer, Arlys John 12/16/2011, 12:27 PM

## 2011-12-16 NOTE — Progress Notes (Signed)
OK to D/C home

## 2011-12-16 NOTE — Discharge Summary (Signed)
Physician Discharge Summary  Patient ID: Stacy Johnston MRN: [id number] DOB/AGE: 1965-08-08 46 y.o.  Admit date: 12/14/2011 Discharge date: 12/16/2011  Admission Diagnoses: Wound drainage and swelling  Discharge Diagnoses: Wound drainage and swelling, improved. Active Problems:  * No active hospital problems. *    Discharged Condition: good  Hospital Course: Tylor Gambrill was admitted for observation on 12-14-11 after visiting office with swelling and drainage at posterior cervical incision. Wound culture has shown no growth. She has been afebrile throughout her stay, with normal lab values. She has received Vancomycin IV which will be changed to oral Keflex for home use.  Consults: None  Significant Diagnostic Studies: labs: CBC w/diff, CMP  Treatments: Observation  Discharge Exam: Blood pressure 118/75, pulse 76, temperature 97.9 F (36.6 C), temperature source Oral, resp. rate 18, height 5\' 2"  (1.575 m), weight 75.9 kg (167 lb 5.3 oz), last menstrual period 10/26/2011, SpO2 98.00%. Alert, conversant. Reports pain well controlled when shoulders at rest, pain at incision when out of bed. Incision looks good. Muscle spasm causes tightness along incision as expected. Thin drainage persisted overnight (dry at present). Good strength BUE remains. Labs WNL. Afebrile.    Disposition: 01-Home or Self Care Return office visit before Christmas. Rx's Percocet, Valium, & Keflex.       Medication List     As of 12/16/2011  1:11 PM    TAKE these medications         ALPRAZolam 1 MG tablet   Commonly known as: XANAX   Take 1 mg by mouth at bedtime.      diazepam 5 MG tablet   Commonly known as: VALIUM   Take 5 mg by mouth every 8 (eight) hours as needed. For muscle spasms      docusate sodium 100 MG capsule   Commonly known as: COLACE   Take 100 mg by mouth 2 (two) times daily as needed. For stool softner      doxepin 50 MG capsule   Commonly known as: SINEQUAN   Take 50 mg by  mouth at bedtime.      esomeprazole 40 MG capsule   Commonly known as: NEXIUM   Take 40 mg by mouth daily before breakfast.      hydrochlorothiazide 12.5 MG capsule   Commonly known as: MICROZIDE   Take 12.5 mg by mouth at bedtime.      HYDROcodone-acetaminophen 10-325 MG per tablet   Commonly known as: NORCO   Take 1 tablet by mouth every 8 (eight) hours as needed. For pain      lamoTRIgine 25 MG tablet   Commonly known as: LAMICTAL   Take 50 mg by mouth at bedtime.      metoprolol succinate 50 MG 24 hr tablet   Commonly known as: TOPROL-XL   Take 50 mg by mouth at bedtime. Take with or immediately following a meal.      multivitamin with minerals Tabs   Take 1 tablet by mouth at bedtime.      PROBIOTIC DAILY PO   Take 1 tablet by mouth at bedtime.         Signed: Georgiann Cocker 12/16/2011, 1:11 PM

## 2011-12-16 NOTE — Progress Notes (Signed)
Patient has been discharged this morning per MD, therefore discharge and medication instructions given and dully sign. Awaiting on her ride.

## 2011-12-17 NOTE — Discharge Summary (Signed)
As above.

## 2011-12-17 NOTE — H&P (Signed)
As above.

## 2011-12-17 NOTE — Care Management Note (Signed)
    Page 1 of 1   12/17/2011     8:18:10 AM   CARE MANAGEMENT NOTE 12/17/2011  Patient:  Stacy Johnston, Stacy Johnston   Account Number:  [id number]  Date Initiated:  12/17/2011  Documentation initiated by:  [facility]  Subjective/Objective Assessment:   Admitted with postop wound infection     Action/Plan:   return home, no d/c needs identified   Anticipated DC Date:     Anticipated DC Plan:        DC Planning Services  CM consult      Choice offered to / List presented to:             Status of service:  Completed, signed off Medicare Important Message given?   (If response is "NO", the following Medicare IM given date fields will be blank) Date Medicare IM given:   Date Additional Medicare IM given:    Discharge Disposition:  HOME/SELF CARE  Per UR Regulation:  Reviewed for med. necessity/level of care/duration of stay  If discussed at Long Length of Stay Meetings, dates discussed:    Comments:

## 2011-12-20 LAB — CULTURE, BLOOD (ROUTINE X 2): Culture: NO GROWTH

## 2013-04-30 ENCOUNTER — Encounter: Payer: Self-pay | Admitting: Neurology

## 2013-04-30 ENCOUNTER — Ambulatory Visit (INDEPENDENT_AMBULATORY_CARE_PROVIDER_SITE_OTHER): Payer: BC Managed Care – PPO | Admitting: Neurology

## 2013-04-30 VITALS — BP 128/78 | HR 78 | Temp 98.2°F | Resp 20 | Ht 62.5 in | Wt 178.4 lb

## 2013-04-30 DIAGNOSIS — G43009 Migraine without aura, not intractable, without status migrainosus: Secondary | ICD-10-CM

## 2013-04-30 DIAGNOSIS — G4452 New daily persistent headache (NDPH): Secondary | ICD-10-CM

## 2013-04-30 MED ORDER — TOPIRAMATE 25 MG PO TABS: 25.0000 mg | ORAL_TABLET | Freq: Two times a day (BID) | ORAL | Status: AC

## 2013-04-30 NOTE — Progress Notes (Signed)
NEUROLOGY CONSULTATION NOTE  Stacy Johnston MRN: [id number] DOB: 1966/01/01  Referring provider: Dr. Venetia MaxonStern Primary care provider: Dr. Leodis BinetJaswani  Reason for consult:  Headache  HISTORY OF PRESENT ILLNESS: Stacy Johnston is a 48 year old right-handed woman with history of cervical spondylosis status post C5-C6 posterior fusion, back pain with lumbar radiculopathy, and depression who presents for headache.  Records and images were personally reviewed where available.    Onset:  February 2015. Location:  Bi-frontal, top of head or sometimes in band-like distribution Quality:  Squeezing pressure Intensity:  10/10 pressure (not pain) Aura:  no Associated symptoms:  blurred vision, nausea, vomiting, vertigo, phonophobia, numbness and tingling involving varying areas of the head and neck.  Also noted tinnitus which has improved over the past week, although still present. Duration:  All day Frequency:  There is always at least a mild residual pressure sensation.  Initially constant but increased intensity about 2 days out of the week over the past two weeks (since initiating verapamil) Triggers/exacerbating factors:  none Relieving factors:  Laying down in bed. Activity:  When severe, cannot function.  She saw an optometrist, who found unremarkable exam.  She saw ENT, who suggested possible mild sinusitis and she was given antibiotics.  An MRI of the brain performed 04/03/13, which was personally reviewed and unremarkable.  Past abortive therapy:  Norco (tried once.  Felt sick and ineffective).  Tylenol (ineffective).  Past preventative therapy:  None.  She reports allergy to NSAIDs  Current abortive therapy:  none Current preventative therapy:  Verapamil 120mg  daily (started about 2 weeks ago by another neurologist who suspected cluster headaches). Other medications:  doxepin, lamictal 25mg , Valium 5mg , Xanax 1mg , Toprol  Caffeine:  1-2 cups daily Depression/stress:  Mother passed away in  January but managing pretty well. Sleep hygiene:  Sleeps well with doxepin Family history of headache:  No Personal history of headache.  Developed similar headache about 5 years ago, worse when laying down at night.  Diagnosed with possible pseudotumor cerebri.  She was on Diamox and Zonegran 100mg  daily, which was effective but caused memory problems.  She tried topamax for a few days but stopped due to paresthesias.  She was on Lamictal for 4 years for the headache up until this past January.  She developed the current headaches prior to stopping Lamictal.  She also reports a persistent daily headache in her 8020s that was left-sided.  She was diagnosed with tension headache and was treated successfully with imipramine.  PAST MEDICAL HISTORY: Past Medical History  Diagnosis Date  . PONV (postoperative nausea and vomiting)   . Hypertension     takes meds daily  . Dysrhythmia     PVCs  . UTI (lower urinary tract infection)   . GERD (gastroesophageal reflux disease)   . Vertigo   . Cervical dysphagia   . Phlebitis age 89    Left Calf.     PAST SURGICAL HISTORY: Past Surgical History  Procedure Laterality Date  . Tubal ligation    . Abdominoplasty    . Septoplasty    . Cholecystectomy    . Neck surgery  09/2010    anterior  . Cesarean section    . Posterior cervical fusion/foraminotomy  12/09/2011    Procedure: POSTERIOR CERVICAL FUSION/FORAMINOTOMY LEVEL 1;  Surgeon: Maeola HarmanJoseph Stern, MD;  Location: [facility];  Service: Neurosurgery;  Laterality: N/A;  Cervical five-six Posterior cervical fusion    MEDICATIONS: Current Outpatient Prescriptions on File Prior to Visit  Medication Sig Dispense Refill  . ALPRAZolam (XANAX) 1 MG tablet Take 1 mg by mouth at bedtime.       . docusate sodium (COLACE) 100 MG capsule Take 100 mg by mouth 2 (two) times daily as needed. For stool softner      . doxepin (SINEQUAN) 50 MG capsule Take 50 mg by mouth at bedtime.      Marland Kitchen esomeprazole (NEXIUM) 40  MG capsule Take 40 mg by mouth daily before breakfast.      . hydrochlorothiazide (MICROZIDE) 12.5 MG capsule Take 12.5 mg by mouth at bedtime.       . metoprolol succinate (TOPROL-XL) 50 MG 24 hr tablet Take 50 mg by mouth at bedtime. Take with or immediately following a meal.      . diazepam (VALIUM) 5 MG tablet Take 5 mg by mouth every 8 (eight) hours as needed. For muscle spasms      . HYDROcodone-acetaminophen (NORCO) 10-325 MG per tablet Take 1 tablet by mouth every 8 (eight) hours as needed. For pain      . lamoTRIgine (LAMICTAL) 25 MG tablet Take 50 mg by mouth at bedtime.       . Multiple Vitamin (MULTIVITAMIN WITH MINERALS) TABS Take 1 tablet by mouth at bedtime.       . Probiotic Product (PROBIOTIC DAILY PO) Take 1 tablet by mouth at bedtime.       Current Facility-Administered Medications on File Prior to Visit  Medication Dose Route Frequency Provider Last Rate Last Dose  . acetaminophen (TYLENOL) tablet 650 mg  650 mg Oral Q6H PRN Maeola Harman, MD       Or  . acetaminophen (TYLENOL) suppository 650 mg  650 mg Rectal Q6H PRN Maeola Harman, MD      . dextrose 5 % and 0.45 % NaCl with KCl 20 mEq/L infusion   Intravenous Continuous Maeola Harman, MD      . diazepam (VALIUM) tablet 5-10 mg  5-10 mg Oral Q8H PRN Maeola Harman, MD      . docusate sodium (COLACE) capsule 100 mg  100 mg Oral BID Maeola Harman, MD      . doxepin (SINEQUAN) capsule 50 mg  50 mg Oral QHS Maeola Harman, MD      . hydrochlorothiazide (MICROZIDE) capsule 12.5 mg  12.5 mg Oral Daily Maeola Harman, MD      . HYDROmorphone (DILAUDID) tablet 2 mg  2 mg Oral Q4H PRN Maeola Harman, MD      . lamoTRIgine (LAMICTAL) tablet 50 mg  50 mg Oral QHS Maeola Harman, MD      . metoprolol succinate (TOPROL-XL) 24 hr tablet 50 mg  50 mg Oral QHS Maeola Harman, MD      . ondansetron [facility]) injection 4 mg  4 mg Intravenous Q6H PRN Maeola Harman, MD      . oxyCODONE-acetaminophen (PERCOCET/ROXICET) 5-325 MG per tablet 1-2 tablet  1-2  tablet Oral Q4H PRN Maeola Harman, MD      . pantoprazole (PROTONIX) EC tablet 40 mg  40 mg Oral Daily Maeola Harman, MD      . senna-docusate (Senokot-S) tablet 2 tablet  2 tablet Oral QHS PRN Maeola Harman, MD        ALLERGIES: Allergies  Allergen Reactions  . Dilaudid [Hydromorphone Hcl] Itching  . Nsaids Other (See Comments)    Chest pain    FAMILY HISTORY: Family History  Problem Relation Age of Onset  . Cancer Mother     lung  .  Diabetes Mother   . GER disease Mother   . Atrial fibrillation Father   . GER disease Father   . AAA (abdominal aortic aneurysm) Paternal Grandmother     SOCIAL HISTORY: History   Social History  . Marital Status: Married    Spouse Name: N/A    Number of Children: N/A  . Years of Education: N/A   Occupational History  . Not on file.   Social History Main Topics  . Smoking status: Former Games developer  . Smokeless tobacco: Not on file  . Alcohol Use: Yes     Comment: occasional  . Drug Use: Not on file  . Sexual Activity: Yes    Partners: Male   Other Topics Concern  . Not on file   Social History Narrative  . No narrative on file    REVIEW OF SYSTEMS: Constitutional: No fevers, chills, or sweats, no generalized fatigue, change in appetite Eyes: No visual changes, double vision, eye pain Ear, nose and throat: No hearing loss, ear pain, nasal congestion, sore throat Cardiovascular: No chest pain, palpitations Respiratory:  No shortness of breath at rest or with exertion, wheezes GastrointestinaI: No nausea, vomiting, diarrhea, abdominal pain, fecal incontinence Genitourinary:  No dysuria, urinary retention or frequency Musculoskeletal:  Low back pain.  Mild shoulder tension. Integumentary: No rash, pruritus, skin lesions Neurological: as above Psychiatric: No depression, insomnia, anxiety Endocrine: No palpitations, fatigue, diaphoresis, mood swings, change in appetite, change in weight, increased thirst Hematologic/Lymphatic:  No  anemia, purpura, petechiae. Allergic/Immunologic: no itchy/runny eyes, nasal congestion, recent allergic reactions, rashes  PHYSICAL EXAM: Filed Vitals:   04/30/13 1342  BP: 128/78  Pulse: 78  Temp: 98.2 F (36.8 C)  Resp: 20   General: No acute distress Head:  Normocephalic/atraumatic Neck: supple, no paraspinal tenderness, full range of motion Back: No paraspinal tenderness Heart: regular rate and rhythm Lungs: Clear to auscultation bilaterally. Vascular: No carotid bruits. Neurological Exam: Mental status: alert and oriented to person, place, and time, recent and remote memory intact, fund of knowledge intact, attention and concentration intact, speech fluent and not dysarthric, language intact. Cranial nerves: CN I: not tested CN II: pupils equal, round and reactive to light, visual fields intact, fundi unremarkable, without vessel changes, exudates, hemorrhages or papilledema. CN III, IV, VI:  full range of motion, no nystagmus, no ptosis CN V: facial sensation intact CN VII: upper and lower face symmetric CN VIII: hearing intact CN IX, X: gag intact, uvula midline CN XI: sternocleidomastoid and trapezius muscles intact CN XII: tongue midline Bulk & Tone: normal, no fasciculations. Motor: 5/5 throughout Sensation: temperature and vibration intact. Deep Tendon Reflexes: 2+ throughout, toes down. Finger to nose testing: no dysmetria Heel to shin: no dysmetria Gait: normal station and stride.  Able to turn and walk in tandem. Romberg negative.  IMPRESSION: New persistent daily headache.  The distribution of head pain seems like tension-type however the associated nausea and vomiting suggests migraine component.  Does not appear to be idiopathic intracranial hypotension or cluster headache.  PLAN: 1.  Will try topirmate again at 25mg  at bedtime.  Explained that the paresthesias are usually dose-related.  She will call in 3 weeks with update.  In the meantime, she wishes to  remain on verapamil for now, since it seems to be improved a little. 2.  Follow up in 3 months.  Thank you for allowing me to take part in the care of this patient.  Shon Millet, DO  CC:  Maeola Harman, MD  Donnetta Hutching, MD

## 2013-04-30 NOTE — Patient Instructions (Signed)
Migraine Recommendations: 1.  We will start topamax 25mg  at bedtime.   Possible side effects include: impaired thinking, sedation, paresthesias (numbness and tingling) and weight loss.  It may cause dehydration and there is a small risk for kidney stones, so make sure to stay hydrated with water during the day.  There is also a very small risk for glaucoma, so if you notice any change in your vision while taking this medication, see an ophthalmologist.  There is also a very small risk of possible suicidal ideation, as it the case with all antiepileptic medications.  Continue the verapamil for now.  2.  Limit use of pain relievers to no more than 2 days out of the week.  Eliminate caffeine from your diet. 3.  Keep a headache diary. 4.  Stay adequately hydrated. 5.  Maintain good sleep hygiene 6.  Maintain proper stress management. 7.  Call in 3 weeks with update regarding the topamax and we can make changes if needed. 8.  Follow up in 3 months.

## 2013-05-21 ENCOUNTER — Telehealth: Payer: Self-pay | Admitting: Neurology

## 2013-05-21 NOTE — Telephone Encounter (Signed)
See below note  Medication update

## 2013-05-21 NOTE — Telephone Encounter (Signed)
Pt called wanting to give an update on Topamax 25mg  one tablet a day. Update: Pt is not having side effects but is still having pressure from headaches.

## 2013-05-22 NOTE — Telephone Encounter (Signed)
Increase Topamax to 50mg  at bedtime and call in 4 weeks with another update.

## 2013-05-22 NOTE — Telephone Encounter (Signed)
Topamax 50 mg #30 with 1 HS and 2 refills call to [facility]

## 2013-08-01 ENCOUNTER — Ambulatory Visit: Payer: BC Managed Care – PPO | Admitting: Neurology

## 2013-08-02 ENCOUNTER — Telehealth: Payer: Self-pay | Admitting: Neurology

## 2013-08-02 NOTE — Telephone Encounter (Signed)
Pt no showed follow up appt w/ Dr. Everlena CooperJaffe on 08/01/13. No show letter mailed to pt / Sherri S.

## 2013-09-29 ENCOUNTER — Other Ambulatory Visit: Payer: Self-pay | Admitting: Neurology

## 2014-05-15 ENCOUNTER — Other Ambulatory Visit: Payer: Self-pay | Admitting: Neurosurgery

## 2014-05-17 ENCOUNTER — Other Ambulatory Visit: Payer: Self-pay | Admitting: Neurosurgery

## 2014-05-17 DIAGNOSIS — G4489 Other headache syndrome: Secondary | ICD-10-CM

## 2014-05-17 DIAGNOSIS — M5416 Radiculopathy, lumbar region: Secondary | ICD-10-CM

## 2014-05-28 ENCOUNTER — Ambulatory Visit
Admission: RE | Admit: 2014-05-28 | Discharge: 2014-05-28 | Disposition: A | Discharge status: Home / Self Care | Payer: BLUE CROSS/BLUE SHIELD | Source: Ambulatory Visit | Attending: Neurosurgery | Admitting: Neurosurgery

## 2014-05-28 DIAGNOSIS — G4489 Other headache syndrome: Secondary | ICD-10-CM

## 2014-05-28 DIAGNOSIS — M5416 Radiculopathy, lumbar region: Secondary | ICD-10-CM

## 2016-12-21 IMAGING — MR MR HEAD W/O CM
6 of 8 series · 30 of 48 positions shown · non-contrast
Comparison: Outside brain MRI 10/03/2009

CLINICAL DATA: Chronic head pressure, pain, and dizziness for 2
years beginning shortly after an epidural.

EXAM:
MRI HEAD WITHOUT CONTRAST
TECHNIQUE: Multiplanar, multiecho pulse sequences of the brain and surrounding
structures were obtained without intravenous contrast.

[Series 3: FLAIR · sagittal · 5.0mm · 0.47mm/px · 3 of 23 slices shown (1 of 2)]
[im 1/23]
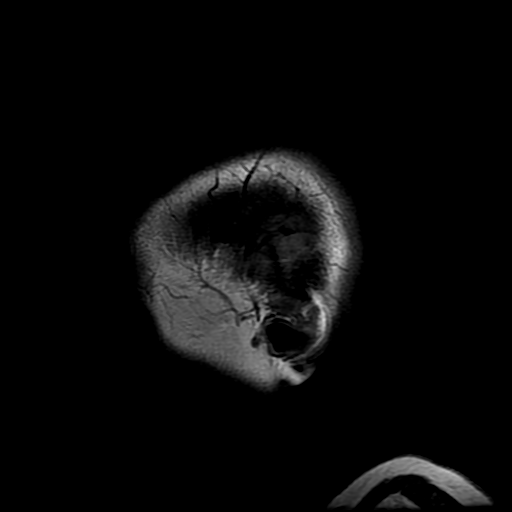
[im 12/23]
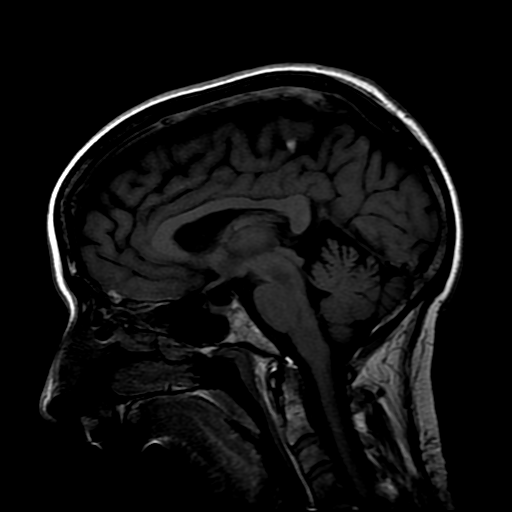
[im 23/23]
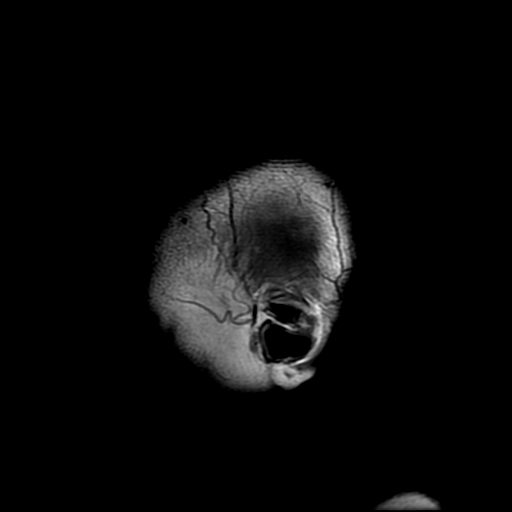

[Series 4: T2-star · axial · 5.0mm · 0.43mm/px · z∈[-85,+9]mm · 3 of 25 slices shown]
[im 1/25]
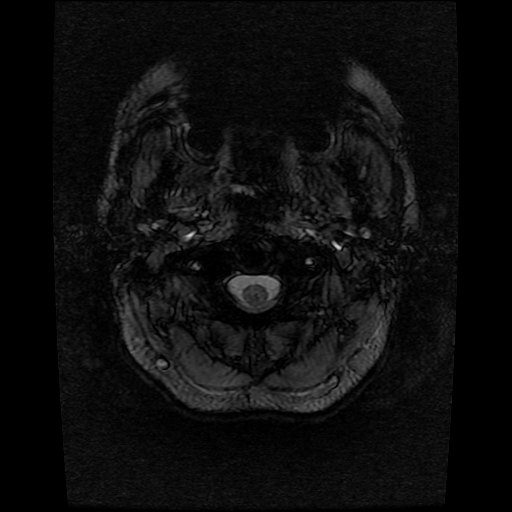
[im 9/25]
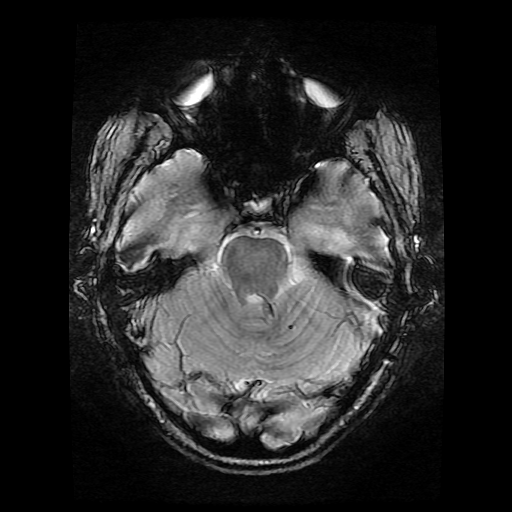
[im 17/25]
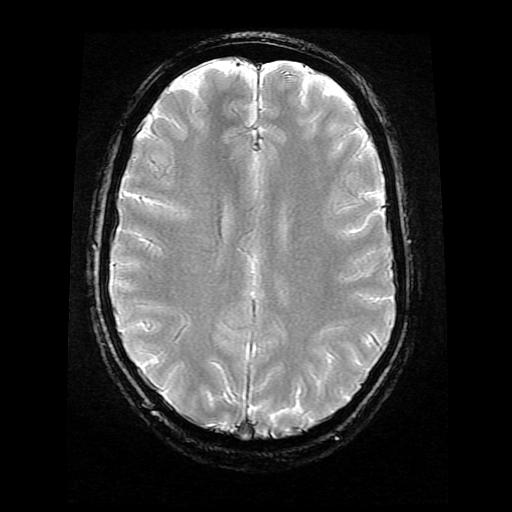

[Series 6: FLAIR · axial · 5.0mm · 0.43mm/px · z∈[-85,+56]mm · 4 of 25 slices shown (2 of 2)]
[im 1/25]
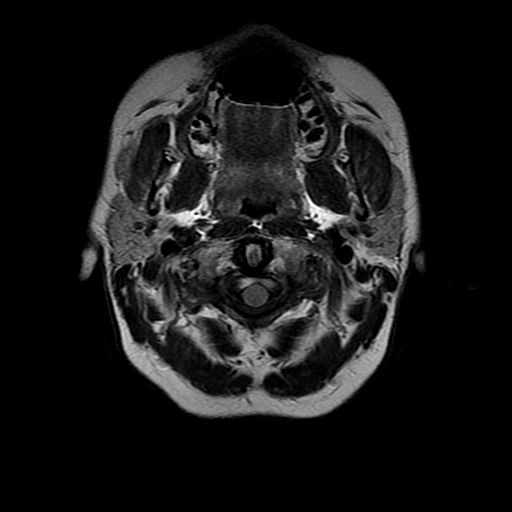
[im 9/25]
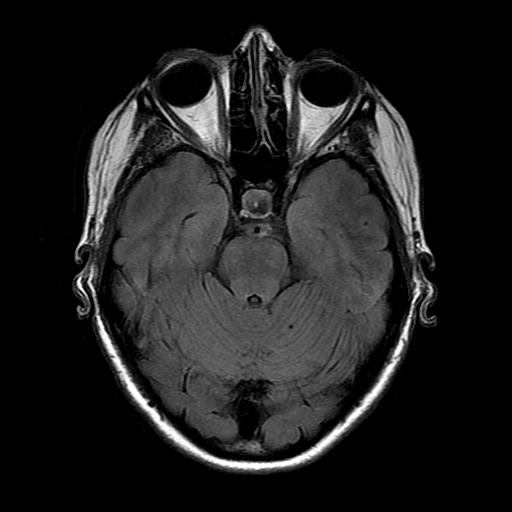
[im 17/25]
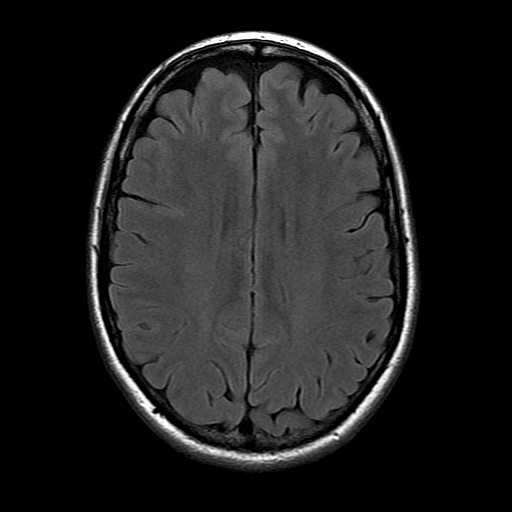
[im 25/25]
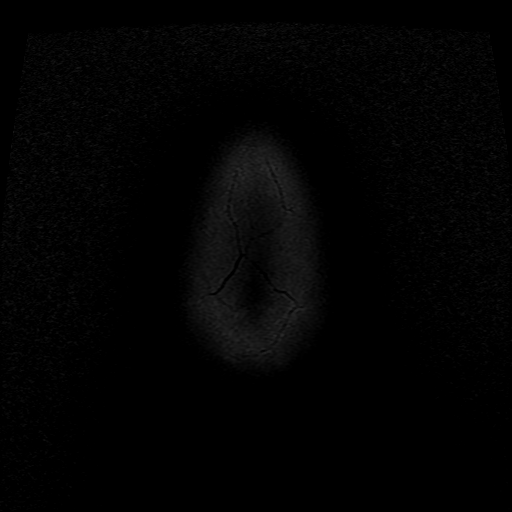

[Series 8: T2 · coronal · 5.0mm · 0.43mm/px · 5 of 30 slices shown]
[im 1/30]
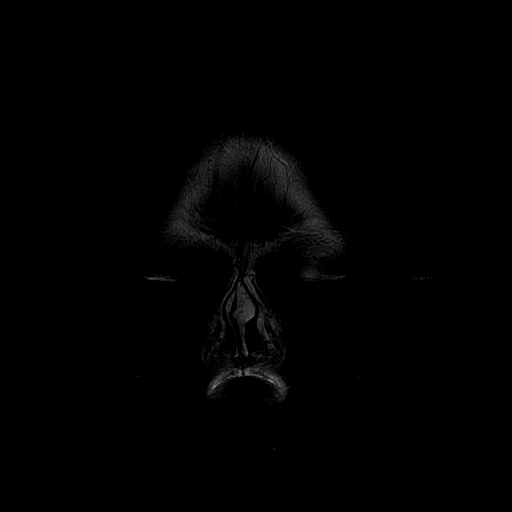
[im 8/30]
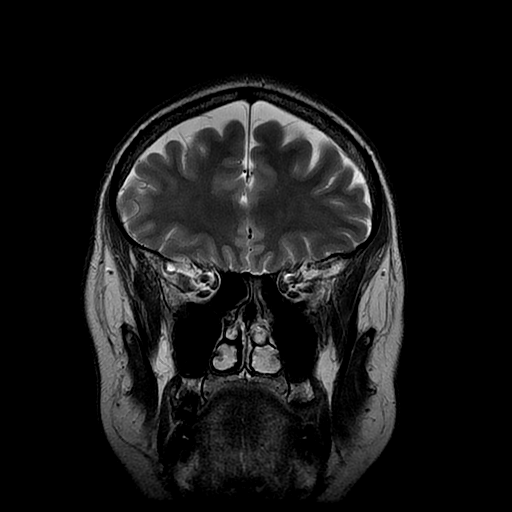
[im 15/30]
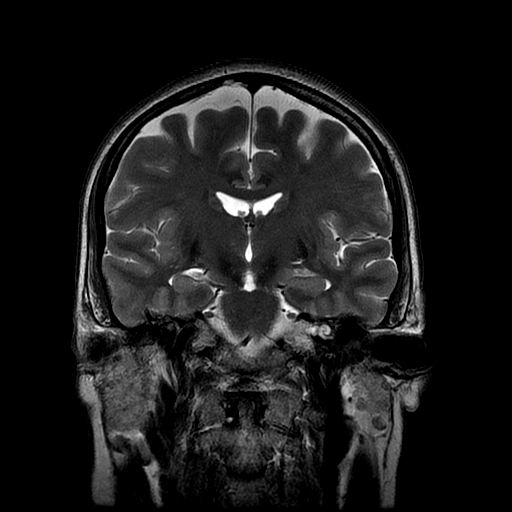
[im 22/30]
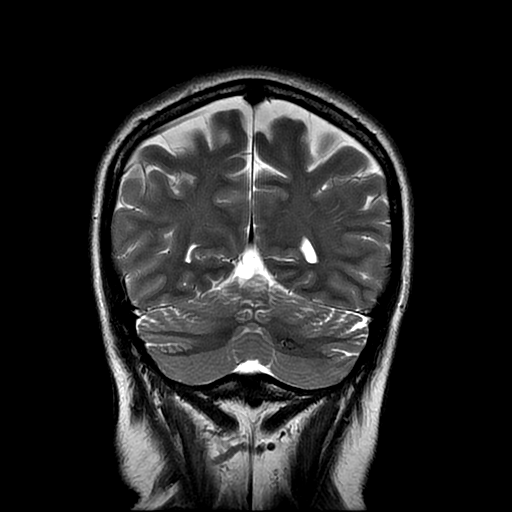
[im 30/30]
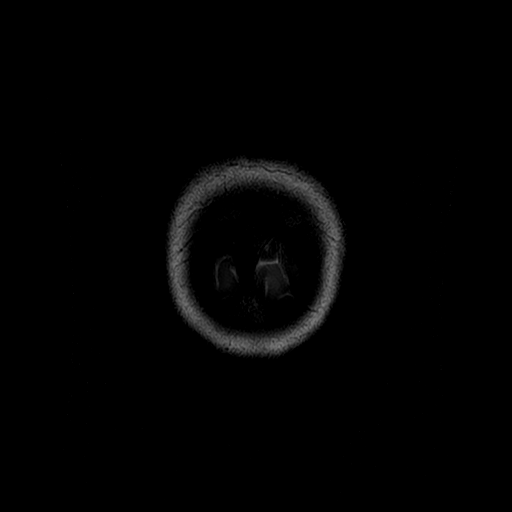

[Series 9: DWI · axial · 3.0mm · 1.09mm/px · z∈[-89,+46]mm · 8 of 94 slices shown (1 of 2)]
[im 1/94]
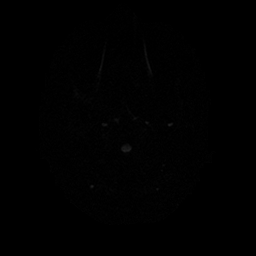
[im 15/94]
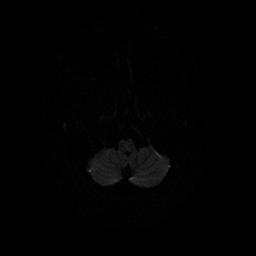
[im 29/94]
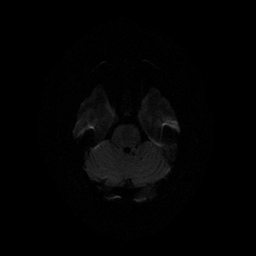
[im 43/94]
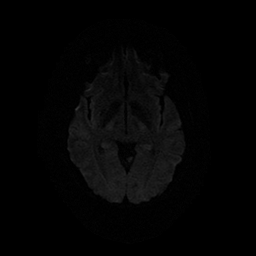
[im 51/94]
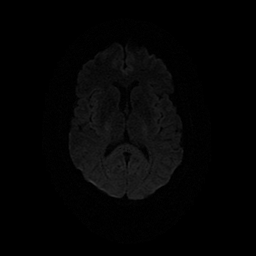
[im 65/94]
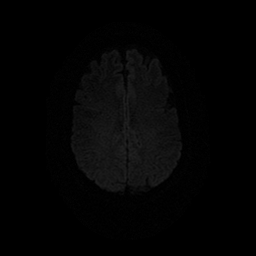
[im 79/94]
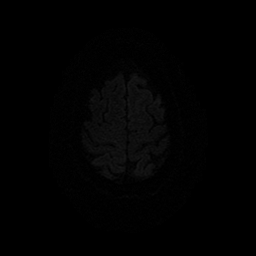
[im 94/94]
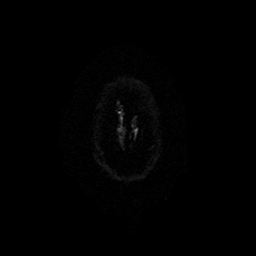

[Series 900: DWI · axial · 3.0mm · 1.09mm/px · z∈[-89,+46]mm · 7 of 47 slices shown (2 of 2)]
[im 1/47]
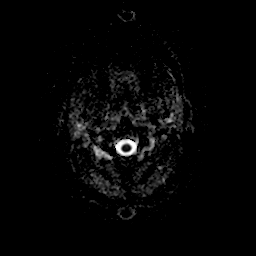
[im 8/47]
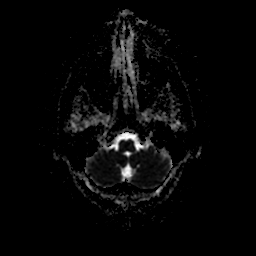
[im 16/47]
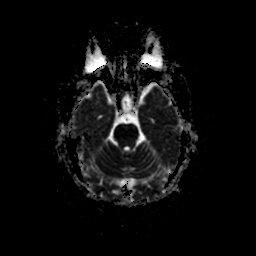
[im 24/47]
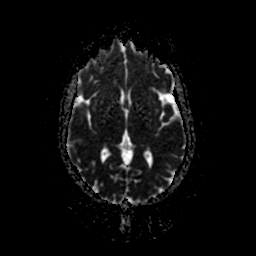
[im 31/47]
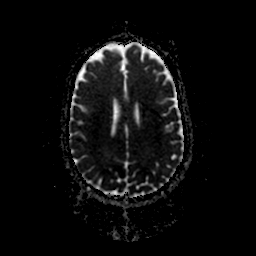
[im 39/47]
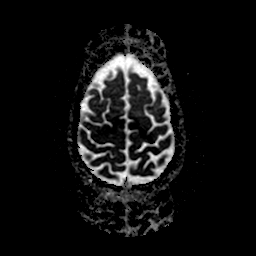
[im 47/47]
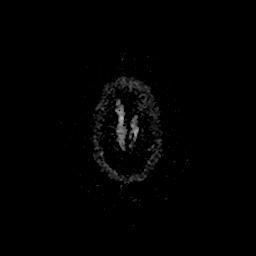

[30 of 48 positions shown; findings below may reference images not displayed]

FINDINGS: There is no evidence of acute infarct, intracranial hemorrhage,
mass, midline shift, or extra-axial fluid collection. Ventricles and
sulci are normal. Branching susceptibility artifact in the left
cerebellum corresponds to a developmental venous anomaly as
demonstrated on prior contrast-enhanced MRI. The brain is otherwise
normal in signal.

Orbits are unremarkable. Paranasal sinuses and mastoid air cells are
clear. Major intracranial vascular flow voids are preserved.
IMPRESSION: Left cerebellar developmental venous anomaly, otherwise unremarkable
brain MRI.

## 2023-07-06 ENCOUNTER — Other Ambulatory Visit: Payer: Self-pay

## 2023-07-06 ENCOUNTER — Emergency Department (HOSPITAL_COMMUNITY)

## 2023-07-06 ENCOUNTER — Encounter (HOSPITAL_COMMUNITY): Payer: Self-pay | Admitting: Emergency Medicine

## 2023-07-06 ENCOUNTER — Emergency Department (HOSPITAL_COMMUNITY)
Admission: EM | Admit: 2023-07-06 | Discharge: 2023-07-06 | Disposition: A | Discharge status: Home / Self Care | Attending: Emergency Medicine | Admitting: Emergency Medicine

## 2023-07-06 DIAGNOSIS — I10 Essential (primary) hypertension: Secondary | ICD-10-CM | POA: Insufficient documentation

## 2023-07-06 DIAGNOSIS — R1084 Generalized abdominal pain: Secondary | ICD-10-CM | POA: Insufficient documentation

## 2023-07-06 DIAGNOSIS — R1013 Epigastric pain: Secondary | ICD-10-CM | POA: Diagnosis present

## 2023-07-06 LAB — COMPREHENSIVE METABOLIC PANEL WITH GFR
ALT: 15 U/L (ref 0–44)
AST: 15 U/L (ref 15–41)
Albumin: 4 g/dL (ref 3.5–5.0)
Alkaline Phosphatase: 50 U/L (ref 38–126)
Anion gap: 9 (ref 5–15)
BUN: 18 mg/dL (ref 6–20)
CO2: 22 mmol/L (ref 22–32)
Calcium: 9.2 mg/dL (ref 8.9–10.3)
Chloride: 102 mmol/L (ref 98–111)
Creatinine, Ser: 0.86 mg/dL (ref 0.44–1.00)
GFR, Estimated: >60 mL/min (ref >60)
Glucose, Bld: 107 mg/dL — ABNORMAL HIGH (ref 70–99)
Potassium: 3.5 mmol/L (ref 3.5–5.1)
Sodium: 133 mmol/L — ABNORMAL LOW (ref 135–145)
Total Bilirubin: 0.3 mg/dL (ref 0.0–1.2)
Total Protein: 6.8 g/dL (ref 6.5–8.1)

## 2023-07-06 LAB — CBC
HCT: 41.5 % (ref 36.0–46.0)
Hemoglobin: 13.8 g/dL (ref 12.0–15.0)
MCH: 29.1 pg (ref 26.0–34.0)
MCHC: 33.3 g/dL (ref 30.0–36.0)
MCV: 87.6 fL (ref 80.0–100.0)
Platelets: 343 10*3/uL (ref 150–400)
RBC: 4.74 MIL/uL (ref 3.87–5.11)
RDW: 14.1 % (ref 11.5–15.5)
WBC: 13.6 10*3/uL — ABNORMAL HIGH (ref 4.0–10.5)
nRBC: 0 % (ref 0.0–0.2)

## 2023-07-06 LAB — HCG, SERUM, QUALITATIVE: Preg, Serum: NEGATIVE

## 2023-07-06 LAB — URINALYSIS, ROUTINE W REFLEX MICROSCOPIC
Bilirubin Urine: NEGATIVE
Glucose, UA: NEGATIVE mg/dL
Hgb urine dipstick: NEGATIVE
Ketones, ur: NEGATIVE mg/dL
Leukocytes,Ua: NEGATIVE
Nitrite: NEGATIVE
Protein, ur: NEGATIVE mg/dL
Specific Gravity, Urine: 1.015 (ref 1.005–1.030)
pH: 7 (ref 5.0–8.0)

## 2023-07-06 LAB — LIPASE, BLOOD: Lipase: 64 U/L — ABNORMAL HIGH (ref 11–51)

## 2023-07-06 LAB — TROPONIN I (HIGH SENSITIVITY)
Troponin I (High Sensitivity): 2 ng/L (ref <18)
Troponin I (High Sensitivity): <2 ng/L (ref <18)

## 2023-07-06 MED ORDER — LACTATED RINGERS IV BOLUS
1000.0000 mL | Freq: Once | INTRAVENOUS | Status: AC
  Administered 2023-07-06: 1000 mL via INTRAVENOUS

## 2023-07-06 MED ORDER — IOHEXOL 350 MG/ML SOLN
80.0000 mL | Freq: Once | INTRAVENOUS | Status: AC | PRN
  Administered 2023-07-06: 80 mL via INTRAVENOUS

## 2023-07-06 MED ORDER — OXYCODONE HCL 5 MG PO TABS: 5.0000 mg | ORAL_TABLET | ORAL | 0 refills | Status: AC | PRN

## 2023-07-06 MED ORDER — MORPHINE SULFATE (PF) 4 MG/ML IV SOLN
4.0000 mg | Freq: Once | INTRAVENOUS | Status: AC
  Administered 2023-07-06: 4 mg via INTRAVENOUS
  Filled 2023-07-06: qty 1

## 2023-07-06 MED ORDER — PANTOPRAZOLE SODIUM 40 MG IV SOLR
40.0000 mg | Freq: Once | INTRAVENOUS | Status: DC
  Filled 2023-07-06: qty 10

## 2023-07-06 MED ORDER — ONDANSETRON 4 MG PO TBDP: 4.0000 mg | ORAL_TABLET | Freq: Three times a day (TID) | ORAL | 0 refills | Status: AC | PRN

## 2023-07-06 MED ORDER — ONDANSETRON HCL 4 MG/2ML IJ SOLN
4.0000 mg | Freq: Once | INTRAMUSCULAR | Status: AC
  Administered 2023-07-06: 4 mg via INTRAVENOUS
  Filled 2023-07-06: qty 2

## 2023-07-06 MED ORDER — OXYCODONE HCL 5 MG PO TABS
5.0000 mg | ORAL_TABLET | Freq: Once | ORAL | Status: AC
  Administered 2023-07-06: 5 mg via ORAL
  Filled 2023-07-06: qty 1

## 2023-07-06 NOTE — Discharge Instructions (Addendum)
 I am glad you are feeling better.  Please follow-up with your primary care provider.  Seek emergency care if experiencing any new or worsening symptoms.  Alternating between 650 mg Tylenol  and 400 mg Advil : The best way to alternate taking Acetaminophen  (example Tylenol ) and Ibuprofen  (example Advil /Motrin ) is to take them 3 hours apart. For example, if you take ibuprofen  at 6 am you can then take Tylenol  at 9 am. You can continue this regimen throughout the day, making sure you do not exceed the recommended maximum dose for each drug.

## 2023-07-06 NOTE — ED Notes (Signed)
Took patient saline lock out patient is getting dressed °

## 2023-07-06 NOTE — ED Notes (Signed)
 Patient dc by RN, instructed to take tylenol  for elevated temp per MD Aldean Amass. Ambulatory to lobby at time of discharge.

## 2023-07-06 NOTE — ED Notes (Signed)
 Patient transported to CT

## 2023-07-06 NOTE — ED Notes (Signed)
 Patient endorses taking 40mg  protonix  this morning, PA notified.

## 2023-07-06 NOTE — ED Provider Notes (Signed)
 [facility] Provider Note   CSN: [id number] Arrival date & time: 07/06/23  0553     History  Chief Complaint  Patient presents with   Abdominal Pain    Stacy Johnston is a 58 y.o. female with PMHx GERD, HTN, vertigo who presents to ED concerned for worsening epigastric pain since 2AM along with nausea. Patient initially thought that pain was d/t gas. Patient stating that the pain is now radiating throughout the abdomen now and she is also experiencing some lower back pain. Endorses eating some cafe food from [employer] last night and wondering if this could be contributing to symptoms. Denies fever, vomiting, diarrhea, dysuria, hematuria, hematochezia. Last BM yesterday was normal.    Abdominal Pain      Home Medications Prior to Admission medications   Medication Sig Start Date End Date Taking? Authorizing Provider  ondansetron  (ZOFRAN -ODT) 4 MG disintegrating tablet Take 1 tablet (4 mg total) by mouth every 8 (eight) hours as needed for nausea. 07/06/23  Yes Dorisann Garre F, PA-C  oxyCODONE  (ROXICODONE ) 5 MG immediate release tablet Take 1 tablet (5 mg total) by mouth every 4 (four) hours as needed for up to 7 doses for severe pain (pain score 7-10). 07/06/23  Yes Dorisann Garre F, PA-C  pantoprazole  (PROTONIX ) 40 MG tablet Take 40 mg by mouth daily.   Yes [provider]  ALPRAZolam  (XANAX ) 1 MG tablet Take 1 mg by mouth at bedtime.     [provider]  amphetamine-dextroamphetamine (ADDERALL XR) 30 MG 24 hr capsule Take 1 capsule by mouth every morning.    [provider]  azelastine (ASTELIN) 137 MCG/SPRAY nasal spray Place into both nostrils 2 (two) times daily. Use in each nostril as directed    [provider]  diazepam  (VALIUM ) 5 MG tablet Take 5 mg by mouth every 8 (eight) hours as needed. For muscle spasms    [provider]  diltiazem (CARDIZEM CD) 240 MG 24 hr capsule Take 240 mg  by mouth daily.    [provider]  docusate sodium  (COLACE) 100 MG capsule Take 100 mg by mouth 2 (two) times daily as needed. For stool softner    [provider]  doxepin  (SINEQUAN ) 50 MG capsule Take 50 mg by mouth at bedtime.    [provider]  esomeprazole (NEXIUM) 40 MG capsule Take 40 mg by mouth daily before breakfast.    [provider]  ezetimibe (ZETIA) 10 MG tablet Take 10 mg by mouth daily. 12/17/22   [provider]  hydrochlorothiazide  (MICROZIDE ) 12.5 MG capsule Take 12.5 mg by mouth at bedtime.     [provider]  lamoTRIgine  (LAMICTAL ) 25 MG tablet Take 50 mg by mouth at bedtime.     [provider]  metoprolol  succinate (TOPROL -XL) 50 MG 24 hr tablet Take 50 mg by mouth at bedtime. Take with or immediately following a meal.    [provider]  montelukast (SINGULAIR) 10 MG tablet Take 10 mg by mouth at bedtime.    [provider]  Multiple Vitamin (MULTIVITAMIN WITH MINERALS) TABS Take 1 tablet by mouth at bedtime.     [provider]  Probiotic Product (PROBIOTIC DAILY PO) Take 1 tablet by mouth at bedtime.    [provider]  topiramate  (TOPAMAX ) 25 MG tablet Take 1 tablet (25 mg total) by mouth 2 (two) times daily. 04/30/13   Merriam Abbey, DO  topiramate  (TOPAMAX ) 50 MG  tablet TAKE 1 TABLET BY MOUTH EVERY NIGHT AT BEDTIME 10/03/13   Jaffe, Adam R, DO  verapamil (CALAN) 120 MG tablet Take 120 mg by mouth once.    [provider]      Allergies    Bactrim [sulfamethoxazole-trimethoprim], Dilaudid  [hydromorphone  hcl], and Nsaids    Review of Systems   Review of Systems  Gastrointestinal:  Positive for abdominal pain.    Physical Exam Updated Vital Signs BP 101/60   Pulse 100   Temp 99 F (37.2 C) (Oral)   Resp 16   Ht 5' 1 (1.549 m)   Wt 61.2 kg   SpO2 97%   BMI 25.51 kg/m  Physical Exam Vitals and nursing note reviewed.  Constitutional:      General:  She is not in acute distress.    Appearance: She is not ill-appearing or toxic-appearing.  HENT:     Head: Normocephalic and atraumatic.     Mouth/Throat:     Mouth: Mucous membranes are moist.     Pharynx: No posterior oropharyngeal erythema.  Eyes:     General: No scleral icterus.       Right eye: No discharge.        Left eye: No discharge.     Conjunctiva/sclera: Conjunctivae normal.  Cardiovascular:     Rate and Rhythm: Normal rate and regular rhythm.     Pulses: Normal pulses.     Heart sounds: Normal heart sounds. No murmur heard. Pulmonary:     Effort: Pulmonary effort is normal. No respiratory distress.     Breath sounds: Normal breath sounds. No wheezing, rhonchi or rales.  Abdominal:     General: Abdomen is flat. Bowel sounds are normal. There is no distension.     Palpations: Abdomen is soft. There is no mass.     Tenderness: There is generalized abdominal tenderness.  Musculoskeletal:     Right lower leg: No edema.     Left lower leg: No edema.  Skin:    General: Skin is warm and dry.     Findings: No rash.  Neurological:     General: No focal deficit present.     Mental Status: She is alert and oriented to person, place, and time. Mental status is at baseline.  Psychiatric:        Mood and Affect: Mood normal.        Behavior: Behavior normal.     ED Results / Procedures / Treatments   Labs (all labs ordered are listed, but only abnormal results are displayed) Labs Reviewed  LIPASE, BLOOD - Abnormal; Notable for the following components:      Result Value   Lipase 64 (*)    All other components within normal limits  COMPREHENSIVE METABOLIC PANEL WITH GFR - Abnormal; Notable for the following components:   Sodium 133 (*)    Glucose, Bld 107 (*)    All other components within normal limits  CBC - Abnormal; Notable for the following components:   WBC 13.6 (*)    All other components within normal limits  URINALYSIS, ROUTINE W REFLEX MICROSCOPIC -  Abnormal; Notable for the following components:   APPearance HAZY (*)    All other components within normal limits  HCG, SERUM, QUALITATIVE  TROPONIN I (HIGH SENSITIVITY)  TROPONIN I (HIGH SENSITIVITY)    EKG EKG Interpretation Date/Time:  Wednesday July 06 2023 09:37:45 EDT Ventricular Rate:  108 PR Interval:  166 QRS Duration:  84 QT Interval:  342  QTC Calculation: 459 R Axis:   70  Text Interpretation: Sinus tachycardia Low voltage, precordial leads Confirmed by Jerilynn Montenegro [id number]) on 07/06/2023 9:57:13 AM  Radiology CT Angio Chest/Abd/Pel for Dissection W and/or W/WO Result Date: 07/06/2023 CLINICAL DATA:  Acute aortic syndrome EXAM: CT ANGIOGRAPHY CHEST, ABDOMEN AND PELVIS TECHNIQUE: Non-contrast CT of the chest was initially obtained. Multidetector CT imaging through the chest, abdomen and pelvis was performed using the standard protocol during bolus administration of intravenous contrast. Multiplanar reconstructed images and MIPs were obtained and reviewed to evaluate the vascular anatomy. RADIATION DOSE REDUCTION: This exam was performed according to the departmental dose-optimization program which includes automated exposure control, adjustment of the mA and/or kV according to patient size and/or use of iterative reconstruction technique. CONTRAST:  80mL OMNIPAQUE IOHEXOL 350 MG/ML SOLN COMPARISON:  None Available. FINDINGS: CTA CHEST FINDINGS Cardiovascular: Satisfactory opacification of the pulmonary arteries to the segmental level. No evidence of pulmonary embolism. Normal heart size. No pericardial effusion. Mediastinum/Nodes: No enlarged mediastinal, hilar, or axillary lymph nodes. Thyroid gland, trachea, and esophagus demonstrate no significant findings. Lungs/Pleura: Lungs are clear. No pleural effusion or pneumothorax. Musculoskeletal: No chest wall abnormality. No acute or significant osseous findings. Review of the MIP images confirms the above findings. CTA ABDOMEN AND  PELVIS FINDINGS VASCULAR Aorta: Normal caliber aorta without aneurysm, dissection, vasculitis or significant stenosis. Celiac: Patent without evidence of aneurysm, dissection, vasculitis or significant stenosis. SMA: Patent without evidence of aneurysm, dissection, vasculitis or significant stenosis. Renals: Both renal arteries are patent without evidence of aneurysm, dissection, vasculitis, fibromuscular dysplasia or significant stenosis. IMA: Patent without evidence of aneurysm, dissection, vasculitis or significant stenosis. Inflow: Patent without evidence of aneurysm, dissection, vasculitis or significant stenosis. Veins: No obvious venous abnormality within the limitations of this arterial phase study. Review of the MIP images confirms the above findings. NON-VASCULAR Hepatobiliary: No focal liver abnormality is seen. Status post cholecystectomy. No biliary dilatation. Pancreas: Unremarkable. No pancreatic ductal dilatation or surrounding inflammatory changes. Spleen: Normal in size without focal abnormality. Adrenals/Urinary Tract: Adrenal glands are unremarkable. Kidneys are normal, without renal calculi, focal lesion, or hydronephrosis. Bladder is unremarkable. Stomach/Bowel: Stomach is within normal limits. Appendix appears normal. No evidence of bowel wall thickening, distention, or inflammatory changes. Lymphatic: Unremarkable Reproductive: Status post hysterectomy. No adnexal masses. Other: No abdominal wall hernia or abnormality. No abdominopelvic ascites. Musculoskeletal: No acute or significant osseous findings. Review of the MIP images confirms the above findings. IMPRESSION: *No evidence of aortic aneurysm or dissection. *No evidence of pulmonary embolism. *No acute findings in the chest, abdomen or pelvis. *Status post cholecystectomy and hysterectomy. Electronically Signed   By: Fredrich Jefferson M.D.   On: 07/06/2023 08:36    Procedures Procedures    Medications Ordered in ED Medications   pantoprazole  (PROTONIX ) injection 40 mg (40 mg Intravenous Not Given 07/06/23 0954)  lactated ringers  bolus 1,000 mL (0 mLs Intravenous Stopped 07/06/23 0900)  morphine  (PF) 4 MG/ML injection 4 mg (4 mg Intravenous Given 07/06/23 0630)  ondansetron  (ZOFRAN ) injection 4 mg (4 mg Intravenous Given 07/06/23 0639)  iohexol (OMNIPAQUE) 350 MG/ML injection 80 mL (80 mLs Intravenous Contrast Given 07/06/23 0756)  morphine  (PF) 4 MG/ML injection 4 mg (4 mg Intravenous Given 07/06/23 0823)  lactated ringers  bolus 1,000 mL (0 mLs Intravenous Stopped 07/06/23 1104)  oxyCODONE  (Oxy IR/ROXICODONE ) immediate release tablet 5 mg (5 mg Oral Given 07/06/23 1016)    ED Course/ Medical Decision Making/ A&P  Medical Decision Making Amount and/or Complexity of Data Reviewed Labs: ordered. Radiology: ordered.  Risk Prescription drug management.    This patient presents to the ED for concern of abdominal pain, this involves an extensive number of treatment options, and is a complaint that carries with it a high risk of complications and morbidity.  The differential diagnosis includes gastroenteritis, colitis, small bowel obstruction, appendicitis, cholecystitis, pancreatitis, nephrolithiasis, UTI, pyelonephritis   Co morbidities that complicate the patient evaluation  GERD, HTN, vertigo   Additional history obtained:  Dr. Sherrlyn Dolores PCP   Problem List / ED Course / Critical interventions / Medication management  Patient presented for abdominal pain and nausea. Hx of cholecystectomy and hysterectomy.  Physical exam with generalized abdominal tenderness to palpation.  Patient also with mildly soft BP at 103/56 and tachycardia in the lower 100's - IV fluids ordered.  Rest of vitals stable.  Rest of physical exam reassuring. I Ordered, and personally interpreted labs.  hCG negative.  Lipase mildly elevated at 64.  CBC with leukocytosis at 13.6.  No anemia.  CMP with mild  hyponatremia at 133.  UA not concerning for infection.  Troponins within normal limits. I ordered imaging studies including CT Abd/Pelvis with contrast: evaluate for structural/surgical etiology of patients' severe abdominal pain. I independently visualized and interpreted imaging and I agree with the radiologist interpretation of no acute process. I personally viewed and interpreted the EKG/cardiac monitored which showed an underlying rhythm of: sinus rhythm It appears that patient's symptoms are d/t possible brewing pancreatitis vs GI bug. Patient now feeling better and is tolerating PO. Educated patient on clear liquid diet. I will send patient home with a couple doses of Oxycodone  5mg  in case this is a mild pancreatitis. Patient verbalized understanding of plan. Patient to follow up with PCP.  I have reviewed the patients home medicines and have made adjustments as needed The patient has been appropriately medically screened and/or stabilized in the ED. I have low suspicion for any other emergent medical condition which would require further screening, evaluation or treatment in the ED or require inpatient management. At time of discharge the patient is hemodynamically stable and in no acute distress. I have discussed work-up results and diagnosis with patient and answered all questions. Patient is agreeable with discharge plan. We discussed strict return precautions for returning to the emergency department and they verbalized understanding.     Social Determinants of Health:  none          Final Clinical Impression(s) / ED Diagnoses Final diagnoses:  Generalized abdominal pain    Rx / DC Orders ED Discharge Orders          Ordered    oxyCODONE  (ROXICODONE ) 5 MG immediate release tablet  Every 4 hours PRN        07/06/23 1109    ondansetron  (ZOFRAN -ODT) 4 MG disintegrating tablet  Every 8 hours PRN        07/06/23 1109              [place], [place] 07/06/23  1212    Jerilynn Montenegro, MD 07/06/23 1458

## 2023-07-06 NOTE — ED Notes (Signed)
Patient given ginger ale and crackers for PO challenge.

## 2023-07-06 NOTE — ED Triage Notes (Signed)
 Pt c/o epigastric pain and back pain that woke her up. Pt endorses nausea.
